# Patient Record
Sex: Male | Born: 1950
Health system: Southern US, Community
[De-identification: ages and names within clinical notes are randomized; demographics above are authoritative.]

## PROBLEM LIST (undated history)

## (undated) DIAGNOSIS — E119 Type 2 diabetes mellitus without complications: Secondary | ICD-10-CM

## (undated) DIAGNOSIS — K219 Gastro-esophageal reflux disease without esophagitis: Secondary | ICD-10-CM

## (undated) DIAGNOSIS — I459 Conduction disorder, unspecified: Secondary | ICD-10-CM

## (undated) DIAGNOSIS — C801 Malignant (primary) neoplasm, unspecified: Secondary | ICD-10-CM

## (undated) DIAGNOSIS — I10 Essential (primary) hypertension: Secondary | ICD-10-CM

## (undated) HISTORY — PX: SHOULDER ARTHROSCOPY: SHX128

## (undated) HISTORY — PX: TONSILLECTOMY: SUR1361

## (undated) HISTORY — PX: NASAL ENDOSCOPY: SHX286

## (undated) HISTORY — PX: ANKLE FRACTURE SURGERY: SHX122

## (undated) HISTORY — PX: SKIN GRAFT: SHX250

---

## 2006-05-03 ENCOUNTER — Other Ambulatory Visit: Payer: Self-pay

## 2006-05-13 ENCOUNTER — Ambulatory Visit: Payer: Self-pay | Admitting: Surgery

## 2008-06-03 ENCOUNTER — Ambulatory Visit: Payer: Self-pay | Admitting: Internal Medicine

## 2009-04-05 ENCOUNTER — Emergency Department: Payer: Self-pay | Admitting: Internal Medicine

## 2011-11-08 ENCOUNTER — Ambulatory Visit: Payer: Self-pay | Admitting: Internal Medicine

## 2011-11-12 ENCOUNTER — Ambulatory Visit: Payer: Self-pay | Admitting: Internal Medicine

## 2011-12-13 ENCOUNTER — Ambulatory Visit: Payer: Self-pay | Admitting: Internal Medicine

## 2013-04-04 ENCOUNTER — Ambulatory Visit: Payer: Self-pay | Admitting: Unknown Physician Specialty

## 2014-03-13 DIAGNOSIS — I10 Essential (primary) hypertension: Secondary | ICD-10-CM | POA: Insufficient documentation

## 2014-03-13 DIAGNOSIS — R002 Palpitations: Secondary | ICD-10-CM | POA: Insufficient documentation

## 2014-03-13 DIAGNOSIS — E782 Mixed hyperlipidemia: Secondary | ICD-10-CM | POA: Insufficient documentation

## 2014-03-13 DIAGNOSIS — E119 Type 2 diabetes mellitus without complications: Secondary | ICD-10-CM | POA: Insufficient documentation

## 2014-04-13 DIAGNOSIS — I739 Peripheral vascular disease, unspecified: Secondary | ICD-10-CM | POA: Insufficient documentation

## 2014-12-31 ENCOUNTER — Ambulatory Visit
Admit: 2014-12-31 | Disposition: A | Discharge status: Home / Self Care | Payer: Self-pay | Attending: Otolaryngology | Admitting: Otolaryngology

## 2014-12-31 DIAGNOSIS — Z0000 Encounter for general adult medical examination without abnormal findings: Secondary | ICD-10-CM | POA: Insufficient documentation

## 2015-01-09 ENCOUNTER — Other Ambulatory Visit: Payer: Self-pay | Admitting: Otolaryngology

## 2015-01-09 DIAGNOSIS — R221 Localized swelling, mass and lump, neck: Secondary | ICD-10-CM

## 2015-01-17 ENCOUNTER — Ambulatory Visit
Admission: RE | Admit: 2015-01-17 | Discharge: 2015-01-17 | Disposition: A | Discharge status: Home / Self Care | Payer: Commercial Managed Care - PPO | Source: Ambulatory Visit | Attending: Otolaryngology | Admitting: Otolaryngology

## 2015-01-17 DIAGNOSIS — R221 Localized swelling, mass and lump, neck: Secondary | ICD-10-CM | POA: Diagnosis not present

## 2015-01-17 HISTORY — DX: Type 2 diabetes mellitus without complications: E11.9

## 2015-01-17 LAB — GLUCOSE, CAPILLARY: GLUCOSE-CAPILLARY: 128 mg/dL — AB (ref 70–99)

## 2015-01-17 MED ORDER — FLUDEOXYGLUCOSE F - 18 (FDG) INJECTION
12.4800 | Freq: Once | INTRAVENOUS | Status: AC | PRN
  Administered 2015-01-17: 12.48 via INTRAVENOUS

## 2015-01-21 ENCOUNTER — Encounter: Payer: Self-pay | Admitting: *Deleted

## 2015-01-21 ENCOUNTER — Inpatient Hospital Stay: Admission: RE | Admit: 2015-01-21 | Payer: Commercial Managed Care - PPO | Source: Ambulatory Visit

## 2015-01-21 DIAGNOSIS — R591 Generalized enlarged lymph nodes: Secondary | ICD-10-CM | POA: Diagnosis present

## 2015-01-21 DIAGNOSIS — Z823 Family history of stroke: Secondary | ICD-10-CM | POA: Diagnosis not present

## 2015-01-21 DIAGNOSIS — Z79899 Other long term (current) drug therapy: Secondary | ICD-10-CM | POA: Diagnosis not present

## 2015-01-21 DIAGNOSIS — Z9889 Other specified postprocedural states: Secondary | ICD-10-CM | POA: Diagnosis not present

## 2015-01-21 DIAGNOSIS — I739 Peripheral vascular disease, unspecified: Secondary | ICD-10-CM | POA: Diagnosis not present

## 2015-01-21 DIAGNOSIS — C029 Malignant neoplasm of tongue, unspecified: Secondary | ICD-10-CM | POA: Diagnosis not present

## 2015-01-21 DIAGNOSIS — Z87891 Personal history of nicotine dependence: Secondary | ICD-10-CM | POA: Diagnosis not present

## 2015-01-21 DIAGNOSIS — I10 Essential (primary) hypertension: Secondary | ICD-10-CM | POA: Diagnosis not present

## 2015-01-21 DIAGNOSIS — E119 Type 2 diabetes mellitus without complications: Secondary | ICD-10-CM | POA: Diagnosis not present

## 2015-01-22 ENCOUNTER — Ambulatory Visit
Admission: RE | Admit: 2015-01-22 | Discharge: 2015-01-22 | Disposition: A | Discharge status: Home / Self Care | Payer: Commercial Managed Care - PPO | Source: Ambulatory Visit | Attending: Otolaryngology | Admitting: Otolaryngology

## 2015-01-22 ENCOUNTER — Other Ambulatory Visit: Payer: Self-pay

## 2015-01-22 ENCOUNTER — Encounter: Payer: Self-pay | Admitting: *Deleted

## 2015-01-22 ENCOUNTER — Other Ambulatory Visit (HOSPITAL_COMMUNITY): Payer: Self-pay

## 2015-01-22 ENCOUNTER — Ambulatory Visit: Payer: Commercial Managed Care - PPO | Admitting: Anesthesiology

## 2015-01-22 ENCOUNTER — Encounter
Admission: RE | Disposition: A | Discharge status: Home / Self Care | Payer: Self-pay | Source: Ambulatory Visit | Attending: Otolaryngology

## 2015-01-22 DIAGNOSIS — Z823 Family history of stroke: Secondary | ICD-10-CM | POA: Insufficient documentation

## 2015-01-22 DIAGNOSIS — Z79899 Other long term (current) drug therapy: Secondary | ICD-10-CM | POA: Insufficient documentation

## 2015-01-22 DIAGNOSIS — I739 Peripheral vascular disease, unspecified: Secondary | ICD-10-CM | POA: Insufficient documentation

## 2015-01-22 DIAGNOSIS — I10 Essential (primary) hypertension: Secondary | ICD-10-CM | POA: Insufficient documentation

## 2015-01-22 DIAGNOSIS — Z9889 Other specified postprocedural states: Secondary | ICD-10-CM | POA: Insufficient documentation

## 2015-01-22 DIAGNOSIS — E119 Type 2 diabetes mellitus without complications: Secondary | ICD-10-CM | POA: Insufficient documentation

## 2015-01-22 DIAGNOSIS — C029 Malignant neoplasm of tongue, unspecified: Secondary | ICD-10-CM | POA: Insufficient documentation

## 2015-01-22 DIAGNOSIS — Z87891 Personal history of nicotine dependence: Secondary | ICD-10-CM | POA: Insufficient documentation

## 2015-01-22 HISTORY — DX: Gastro-esophageal reflux disease without esophagitis: K21.9

## 2015-01-22 HISTORY — PX: DIRECT LARYNGOSCOPY: SHX5326

## 2015-01-22 HISTORY — DX: Conduction disorder, unspecified: I45.9

## 2015-01-22 HISTORY — DX: Essential (primary) hypertension: I10

## 2015-01-22 LAB — GLUCOSE, CAPILLARY: Glucose-Capillary: 111 mg/dL — ABNORMAL HIGH (ref 70–99)

## 2015-01-22 SURGERY — LARYNGOSCOPY, DIRECT
Anesthesia: General

## 2015-01-22 MED ORDER — LIDOCAINE-EPINEPHRINE (PF) 1 %-1:200000 IJ SOLN
INTRAMUSCULAR | Status: AC
  Filled 2015-01-22: qty 30

## 2015-01-22 MED ORDER — SUCCINYLCHOLINE CHLORIDE 20 MG/ML IJ SOLN
INTRAMUSCULAR | Status: DC | PRN
  Administered 2015-01-22: 100 mg via INTRAVENOUS

## 2015-01-22 MED ORDER — LACTATED RINGERS IV SOLN
INTRAVENOUS | Status: DC
  Administered 2015-01-22: 09:00:00 via INTRAVENOUS

## 2015-01-22 MED ORDER — FENTANYL CITRATE (PF) 100 MCG/2ML IJ SOLN: 25.0000 ug | INTRAMUSCULAR | Status: DC | PRN

## 2015-01-22 MED ORDER — MIDAZOLAM HCL 5 MG/5ML IJ SOLN
INTRAMUSCULAR | Status: DC | PRN
  Administered 2015-01-22: 1 mg via INTRAVENOUS

## 2015-01-22 MED ORDER — FAMOTIDINE 20 MG PO TABS
ORAL_TABLET | ORAL | Status: AC
  Filled 2015-01-22: qty 1

## 2015-01-22 MED ORDER — ONDANSETRON HCL 4 MG/2ML IJ SOLN
INTRAMUSCULAR | Status: DC | PRN
  Administered 2015-01-22: 4 mg via INTRAVENOUS

## 2015-01-22 MED ORDER — FENTANYL CITRATE (PF) 250 MCG/5ML IJ SOLN
INTRAMUSCULAR | Status: DC | PRN
  Administered 2015-01-22: 100 ug via INTRAVENOUS

## 2015-01-22 MED ORDER — HYDROCODONE-ACETAMINOPHEN 7.5-325 MG/15ML PO SOLN: 15.0000 mL | ORAL | Status: DC | PRN

## 2015-01-22 MED ORDER — LIDOCAINE HCL (CARDIAC) 20 MG/ML IV SOLN
INTRAVENOUS | Status: DC | PRN
  Administered 2015-01-22: 80 mg via INTRAVENOUS

## 2015-01-22 MED ORDER — FAMOTIDINE 20 MG PO TABS
20.0000 mg | ORAL_TABLET | Freq: Once | ORAL | Status: AC
  Administered 2015-01-22: 20 mg via ORAL

## 2015-01-22 MED ORDER — OXYMETAZOLINE HCL 0.05 % NA SOLN
NASAL | Status: DC | PRN
  Administered 2015-01-22: 5 via NASAL

## 2015-01-22 MED ORDER — ROCURONIUM BROMIDE 100 MG/10ML IV SOLN
INTRAVENOUS | Status: DC | PRN
  Administered 2015-01-22: 10 mg via INTRAVENOUS

## 2015-01-22 MED ORDER — PROPOFOL 10 MG/ML IV BOLUS
INTRAVENOUS | Status: DC | PRN
  Administered 2015-01-22: 200 mg via INTRAVENOUS

## 2015-01-22 MED ORDER — ONDANSETRON HCL 4 MG/2ML IJ SOLN: 4.0000 mg | Freq: Once | INTRAMUSCULAR | Status: DC | PRN

## 2015-01-22 MED ORDER — OXYMETAZOLINE HCL 0.05 % NA SOLN
NASAL | Status: AC
  Filled 2015-01-22: qty 15

## 2015-01-22 SURGICAL SUPPLY — 13 items
DRESSING TELFA 4X3 1S ST N-ADH (GAUZE/BANDAGES/DRESSINGS) ×3 IMPLANT
GLOVE BIO SURGEON STRL SZ7.5 (GLOVE) ×6 IMPLANT
GOWN STRL REUS W/ TWL LRG LVL3 (GOWN DISPOSABLE) ×1 IMPLANT
GOWN STRL REUS W/TWL LRG LVL3 (GOWN DISPOSABLE) ×2
IV SET EXTENSION MINI BORE EPI (IV SETS) IMPLANT
LABEL OR SOLS (LABEL) IMPLANT
NDL ENDOSCOPIC URO 20G (NEEDLE) ×3 IMPLANT
NEEDLE FILTER BLUNT 18X 1/2SAF (NEEDLE) ×2
NEEDLE FILTER BLUNT 18X1 1/2 (NEEDLE) ×1 IMPLANT
PACK HEAD/NECK (MISCELLANEOUS) ×3 IMPLANT
PATTIES SURGICAL .5 X.5 (GAUZE/BANDAGES/DRESSINGS) ×3 IMPLANT
SOL ANTI-FOG 6CC FOG-OUT (MISCELLANEOUS) ×1 IMPLANT
SOL FOG-OUT ANTI-FOG 6CC (MISCELLANEOUS) ×2

## 2015-01-22 NOTE — Anesthesia Postprocedure Evaluation (Signed)
  Anesthesia Post-op Note  Patient: Manuel Buck  Procedure(s) Performed: Procedure(s): DIRECT LARYNGOSCOPY/LARYNGOSCOPY WITH BIOPSY (N/A)  Anesthesia type:General ETT  Patient location: PACU  Post pain: Pain level controlled  Post assessment: Post-op Vital signs reviewed, Patient's Cardiovascular Status Stable, Respiratory Function Stable, Patent Airway and No signs of Nausea or vomiting  Post vital signs: Reviewed and stable  Last Vitals:  Filed Vitals:   01/22/15 1116  BP:   Pulse: 82  Temp:   Resp: 16    Level of consciousness: awake, alert  and patient cooperative  Complications: No apparent anesthesia complications

## 2015-01-22 NOTE — Anesthesia Preprocedure Evaluation (Addendum)
Anesthesia Evaluation  Patient identified by MRN, date of birth, ID band Patient awake    Reviewed: Allergy & Precautions, H&P , Patient's Chart, lab work & pertinent test results  Airway Mallampati: II  TM Distance: >3 FB Neck ROM: Full    Dental  (+) Missing   Pulmonary former smoker (quit x 16 years),          Cardiovascular hypertension, Pt. on medications + dysrhythmias (Occasional PVCs)     Neuro/Psych    GI/Hepatic   Endo/Other  diabetes (On no meds now), Well Controlled, Type 2  Renal/GU      Musculoskeletal   Abdominal   Peds  Hematology   Anesthesia Other Findings   Reproductive/Obstetrics                            Anesthesia Physical Anesthesia Plan  ASA: III  Anesthesia Plan: General ETT   Post-op Pain Management: MAC Combined w/ Regional for Post-op pain   Induction:   Airway Management Planned:   Additional Equipment:   Intra-op Plan:   Post-operative Plan:   Informed Consent:   Plan Discussed with: CRNA  Anesthesia Plan Comments:         Anesthesia Quick Evaluation

## 2015-01-22 NOTE — Discharge Instructions (Addendum)
No heavy lifting. Soft, bland diet, advance as tolerated. Take prescribed pain medication as needed for pain. May switch to plain Tylenol when pain improves. No driving while using narcotic pain medication. Call for difficulty breathing, heavy bleeding from throat, inability to drink fluids.General Anesthesia General anesthesia is a sleep-like state of non-feeling produced by medicines (anesthetics). General anesthesia prevents you from being alert and feeling pain during a medical procedure. Your caregiver may recommend general anesthesia if your procedure:  Is long.  Is painful or uncomfortable.  Would be frightening to see or hear.  Requires you to be still.  Affects your breathing.  Causes significant blood loss. LET YOUR CAREGIVER KNOW ABOUT:  Allergies to food or medicine.  Medicines taken, including vitamins, herbs, eyedrops, over-the-counter medicines, and creams.  Use of steroids (by mouth or creams).  Previous problems with anesthetics or numbing medicines, including problems experienced by relatives.  History of bleeding problems or blood clots.  Previous surgeries and types of anesthetics received.  Possibility of pregnancy, if this applies.  Use of cigarettes, alcohol, or illegal drugs.  Any health condition(s), especially diabetes, sleep apnea, and high blood pressure. RISKS AND COMPLICATIONS General anesthesia rarely causes complications. However, if complications do occur, they can be life threatening. Complications include:  A lung infection.  A stroke.  A heart attack.  Waking up during the procedure. When this occurs, the patient may be unable to move and communicate that he or she is awake. The patient may feel severe pain. Older adults and adults with serious medical problems are more likely to have complications than adults who are young and healthy. Some complications can be prevented by answering all of your caregiver's questions thoroughly and by  following all pre-procedure instructions. It is important to tell your caregiver if any of the pre-procedure instructions, especially those related to diet, were not followed. Any food or liquid in the stomach can cause problems when you are under general anesthesia. BEFORE THE PROCEDURE  Ask your caregiver if you will have to spend the night at the hospital. If you will not have to spend the night, arrange to have an adult drive you and stay with you for 24 hours.  Follow your caregiver's instructions if you are taking dietary supplements or medicines. Your caregiver may tell you to stop taking them or to reduce your dosage.  Do not smoke for as long as possible before your procedure. If possible, stop smoking 3-6 weeks before the procedure.  Do not take new dietary supplements or medicines within 1 week of your procedure unless your caregiver approves them.  Do not eat within 8 hours of your procedure or as directed by your caregiver. Drink only clear liquids, such as water, black coffee (without milk or cream), and fruit juices (without pulp).  Do not drink within 3 hours of your procedure or as directed by your caregiver.  You may brush your teeth on the morning of the procedure, but make sure to spit out the toothpaste and water when finished. PROCEDURE  You will receive anesthetics through a mask, through an intravenous (IV) access tube, or through both. A doctor who specializes in anesthesia (anesthesiologist) or a nurse who specializes in anesthesia (nurse anesthetist) or both will stay with you throughout the procedure to make sure you remain unconscious. He or she will also watch your blood pressure, pulse, and oxygen levels to make sure that the anesthetics do not cause any problems. Once you are asleep, a breathing tube or  mask may be used to help you breathe. AFTER THE PROCEDURE You will wake up after the procedure is complete. You may be in the room where the procedure was performed  or in a recovery area. You may have a sore throat if a breathing tube was used. You may also feel:  Dizzy.  Weak.  Drowsy.  Confused.  Nauseous.  Cold. These are all normal responses and can be expected to last for up to 24 hours after the procedure is complete. A caregiver will tell you when you are ready to go home. This will usually be when you are fully awake and in stable condition. Document Released: 12/07/2007 Document Revised: 01/14/2014 Document Reviewed: 12/29/2011 [facility] Patient Information 2015 [place], [place]. This information is not intended to replace advice given to you by your health care provider. Make sure you discuss any questions you have with your health care provider. Laryngoscopy A laryngoscopy is a procedure performed to view the back of the throat, vocal cords, and voice box (larynx). It may be done in order to figure out why a person has:  A cough.  Voice changes, such as new hoarseness.  Throat pain.  Problems swallowing. During a laryngoscopy, tissue samples (biopsies) can be taken or foreign bodies can be removed. A laryngoscopy can be done in the caregiver's office. It may be performed with a hand-held mirror, or it may require the use of a fiberoptic scope. Under some circumstances, it may be done in a hospital with medicine to help you sleep (general anesthesia). LET YOUR CAREGIVER KNOW ABOUT:   Allergies.  Medicines taken, including herbs, eyedrops, over-the-counter medicines, and creams.  Use of steroids (by mouth or creams).  Previous problems with anesthetics or numbing medicines.  History of bleeding or blood problems.  History of blood clots.  Possibility of pregnancy, if this applies.  Previous surgery.  Other health problems. RISKS AND COMPLICATIONS   Pain.  Gagging.  Vomiting.  Swelling.  Bleeding.  Problems from anesthesia. BEFORE THE PROCEDURE  Several days before the procedure, you may have blood tests to make  sure the blood clots normally. You may be asked to stop taking blood thinners, aspirin, and/or nonsteroidal anti-inflammatory drugs (NSAIDs) before the procedure. Do not give aspirin to children. If general anesthesia is going to be used, you will usually be asked to stop eating and drinking at least 8 hours before the procedure. Have someone go with you to the procedure in order to drive you home afterward. PROCEDURE  If you are having the procedure in the caregiver's office, it is usually performed while sitting up in a special exam chair with a headrest. A numbing medicine will be sprayed into the mouth and on the back of the throat. Your caregiver will use a gauze pad to hold the tongue out of the way. A mirror will be held at the back of the throat to allow your caregiver to see down the throat. You may be asked to make certain sounds so that vocal cord movement can be observed. If a fiberoptic scope is being used, it will be inserted into either the nose or the mouth and slipped into the throat. Your caregiver can look through an eyepiece or can see an image projected on a monitor. Pieces of tissue can be taken (biopsies) or foreign bodies removed. If you need to have the procedure performed under general anesthesia, you will be lying down on a special operating table. The same kinds of procedures will be  followed, but you will not be aware of them. AFTER THE PROCEDURE   When laryngoscopy is done with only local numbing, it usually does not require any changes to your activity level after the procedure is complete.  You may have a sore throat.  You may be asked to rest the voice for some length of time after the procedure.  Do not smoke.  Follow your caregiver's directions regarding eating and drinking after the procedure.  If you had a biopsy taken, your caregiver may advise trying to avoid coughing, whispering, or clearing the throat.  If you were given a general anesthetic or a medicine  to help you relax (sedative), you will be sleepy.  There may be some pain or you may feel sick to your stomach (nauseous), but this can usually be controlled with medicines taken by mouth.  You will stay in the recovery room until awake and able to drink fluids.  You can go back to his or her usual level of activity within several days. HOME CARE INSTRUCTIONS   Take all medicines exactly as directed.  Follow any prescribed diet.  Follow instructions regarding rest (including voice rest) and physical activity.  Follow instructions for the use of throat lozenges or gargles. SEEK IMMEDIATE MEDICAL CARE IF:   You have severe pain.  You have new bleeding during coughing, spitting, or vomiting.  You develop nausea and vomiting.  You develop new problems with swallowing.  You have difficulty breathing or have shortness of breath.  You have chest pain.  Your voice changes.  You have a fever.  You develop a cough. MAKE SURE YOU:   Understand these instructions.  Will watch your condition.  Will get help right away if you are not doing well or get worse. Document Released: 11/24/2009 Document Revised: 01/14/2014 Document Reviewed: 11/24/2009 [facility] Patient Information 2015 [place], [place]. This information is not intended to replace advice given to you by your health care provider. Make sure you discuss any questions you have with your health care provider.

## 2015-01-22 NOTE — Op Note (Signed)
01/22/2015  10:56 AM    Manuel Buck  [id number]    Pre-Op Dx:  Metastatic SCCA to left neck  Post-op Dx: Same  Proc:  Direct Laryngoscopy with Biopsy  Surg:  Riley Nearing  Anes:  General Endotracheal  EBL:  Min  Comp:  None  Findings:  2 cm firm mass in left tongue base. 2.5 cm left group 2 cervical lymph node  Procedure: With the patient in a comfortable supine position, general endotracheal anesthesia was induced without difficulty.  At an appropriate level, the table was turned 90 degrees away from Anesthesia.  A clean preparation and draping was performed in the standard fashion. The oropharynx, oral cavity, nasopharynx and hypopharynx were palpated with findings as described above. A mass was palpable in the left tongue base.  A tooth guard was placed.   Using the Dedo laryngoscope, the oropharynx, hypopharynx and larynx were carefully inspected.   Biopsies were taken from the left base of tongue where the mass had been palpated and identified. Bleeding was controlled with Afrin moistened pledgets.  The findings were as described above.  The laryngoscope was removed.  The neck was palpated on both sides with the findings as described above.  At this point the procedure was completed.  Dental status was intact.  The patient was returned to Anesthesia, awakened, extubated, and transferred to PACU in satisfactory condition.   Dispo:   To PACU in stable condition   Plan:  Discharge home  Riley Nearing 01/22/2015 10:56 AM

## 2015-01-22 NOTE — H&P (Signed)
   History reviewed, patient examined, no change in status, stable for surgery.  

## 2015-01-22 NOTE — Anesthesia Procedure Notes (Addendum)
Performed by: Delaney Meigs   Procedure Name: Intubation Date/Time: 01/22/2015 10:18 AM Performed by: Delaney Meigs Pre-anesthesia Checklist: Patient identified, Emergency Drugs available, Suction available, Patient being monitored and Timeout performed Patient Re-evaluated:Patient Re-evaluated prior to inductionOxygen Delivery Method: Circle system utilized Preoxygenation: Pre-oxygenation with 100% oxygen Intubation Type: IV induction Laryngoscope Size: Miller and 2 Grade View: Grade I Tube type: Oral Tube size: 6.5 mm Number of attempts: 1 Placement Confirmation: ETT inserted through vocal cords under direct vision,  positive ETCO2 and breath sounds checked- equal and bilateral Secured at: 22 cm Tube secured with: Tape Dental Injury: Teeth and Oropharynx as per pre-operative assessment

## 2015-01-22 NOTE — Transfer of Care (Signed)
Immediate Anesthesia Transfer of Care Note  Patient: Manuel Buck  Procedure(s) Performed: Procedure(s): DIRECT LARYNGOSCOPY/LARYNGOSCOPY WITH BIOPSY (N/A)  Patient Location: PACU  Anesthesia Type:General  Level of Consciousness: awake, alert  and oriented  Airway & Oxygen Therapy: Patient Spontanous Breathing and Patient connected to face mask oxygen  Post-op Assessment: Report given to RN, Post -op Vital signs reviewed and stable and Patient moving all extremities X 4  Post vital signs: Reviewed and stable  Last Vitals:  Filed Vitals:   01/22/15 0844  BP: 113/57  Pulse: 64  Temp: 36.7 C  Resp: 20    Complications: No apparent anesthesia complications

## 2015-01-24 ENCOUNTER — Inpatient Hospital Stay: Payer: Commercial Managed Care - PPO | Attending: Internal Medicine | Admitting: Internal Medicine

## 2015-01-24 VITALS — BP 99/64 | HR 68 | Temp 97.5°F | Ht 72.0 in | Wt 209.2 lb

## 2015-01-24 DIAGNOSIS — C01 Malignant neoplasm of base of tongue: Secondary | ICD-10-CM

## 2015-01-24 DIAGNOSIS — G893 Neoplasm related pain (acute) (chronic): Secondary | ICD-10-CM | POA: Diagnosis not present

## 2015-01-24 DIAGNOSIS — K219 Gastro-esophageal reflux disease without esophagitis: Secondary | ICD-10-CM | POA: Diagnosis not present

## 2015-01-24 DIAGNOSIS — Z79899 Other long term (current) drug therapy: Secondary | ICD-10-CM | POA: Diagnosis not present

## 2015-01-24 DIAGNOSIS — Z87891 Personal history of nicotine dependence: Secondary | ICD-10-CM | POA: Diagnosis not present

## 2015-01-24 DIAGNOSIS — E119 Type 2 diabetes mellitus without complications: Secondary | ICD-10-CM | POA: Insufficient documentation

## 2015-01-24 DIAGNOSIS — Z5111 Encounter for antineoplastic chemotherapy: Secondary | ICD-10-CM | POA: Diagnosis not present

## 2015-01-24 LAB — SURGICAL PATHOLOGY

## 2015-01-26 ENCOUNTER — Encounter: Payer: Self-pay | Admitting: Otolaryngology

## 2015-01-27 ENCOUNTER — Encounter: Payer: Self-pay | Admitting: Radiation Oncology

## 2015-01-27 ENCOUNTER — Ambulatory Visit
Admission: RE | Admit: 2015-01-27 | Discharge: 2015-01-27 | Disposition: A | Discharge status: Home / Self Care | Payer: Commercial Managed Care - PPO | Source: Ambulatory Visit | Attending: Radiation Oncology | Admitting: Radiation Oncology

## 2015-01-27 VITALS — BP 141/75 | HR 67 | Temp 97.3°F | Resp 18 | Wt 208.3 lb

## 2015-01-27 DIAGNOSIS — Z510 Encounter for antineoplastic radiation therapy: Secondary | ICD-10-CM | POA: Insufficient documentation

## 2015-01-27 DIAGNOSIS — C01 Malignant neoplasm of base of tongue: Secondary | ICD-10-CM | POA: Insufficient documentation

## 2015-01-27 NOTE — Consult Note (Signed)
Radiation Oncology NEW PATIENT EVALUATION  Name: Manuel Buck  MRN: [id number]  Date:   01/27/2015     DOB: Jan 06, 1951   This 64 y.o. male patient presents to the clinic for initial evaluation of carcinoma the base of tongue.Marland Kitchen  REFERRING PHYSICIAN: Kirk Ruths, MD  CHIEF COMPLAINT:  Chief Complaint  Patient presents with  . Cancer    Pt is here for initial consult for tongue cancer.    DIAGNOSIS: The encounter diagnosis was Malignant neoplasm of base of tongue. stage III (T2 N2 M0) squamous cell carcinoma of base of tongue   PREVIOUS INVESTIGATIONS:  PET CT scan and CT scans reviewed Pathology report reviewed Clinical notes reviewed  HPI: Patient is a 64 year old male who presented over the past several months with increasing dysphagia and a lump in the back of his throat. He went on to develop left neck adenopathy which was palpable. He was seen by ear nose and throat surgeon noticed a 2 cm mass on the left side of base of tongue performed biopsy which was positive for poorly differentiated squamous cell carcinoma. PET CT scan was performed showing hypermetabolic activity in a left level XXIII cervical node and a small right cervical node. Base of tongue was also hypermetabolic. No distant disease was noted. Patient has been seen by medical oncology is now referred to radiation oncology for opinion. He does have some slight dysphasia although weight has been stable. No head and neck pain except secondary to the dysphasia.  PLANNED TREATMENT REGIMEN: Concurrent chemotherapy therapy and radiation therapy with curative intent.  PAST MEDICAL HISTORY:  has a past medical history of Hypertension; GERD (gastroesophageal reflux disease); Skipped heart beats; and Diabetes mellitus without complication.    PAST SURGICAL HISTORY:  Past Surgical History  Procedure Laterality Date  . Nasal endoscopy    . Shoulder arthroscopy Right   . Skin graft    . Ankle fracture surgery    .  Tonsillectomy    . Direct laryngoscopy N/A 01/22/2015    Procedure: DIRECT LARYNGOSCOPY/LARYNGOSCOPY WITH BIOPSY;  Surgeon: Clyde Canterbury, MD;  Location: [facility] ORS;  Service: ENT;  Laterality: N/A;    FAMILY HISTORY: family history is not on file.  SOCIAL HISTORY:  reports that he quit smoking about 21 years ago. He does not have any smokeless tobacco history on file. He reports that he does not drink alcohol or use illicit drugs.  ALLERGIES: Prednisone; Statins; and Sulfa antibiotics  MEDICATIONS:  Current Outpatient Prescriptions  Medication Sig Dispense Refill  . acetaminophen (TYLENOL) 500 MG tablet Take 500 mg by mouth every 6 (six) hours as needed for mild pain or moderate pain.    . Cyanocobalamin (VITAMIN B 12 PO) Take 1 capsule by mouth.    Marland Kitchen lisinopril-hydrochlorothiazide (PRINZIDE,ZESTORETIC) 20-12.5 MG per tablet Take 1 tablet by mouth daily.    . MULTIPLE VITAMIN PO Take 1 tablet by mouth.    . cyanocobalamin (,VITAMIN B-12,) 1000 MCG/ML injection Inject into the muscle.    Marland Kitchen HYDROcodone-acetaminophen (HYCET) 7.5-325 mg/15 ml solution Take 15 mLs by mouth every 4 (four) hours as needed for moderate pain. (Patient not taking: Reported on 01/24/2015) 300 mL 0   No current facility-administered medications for this encounter.    ECOG PERFORMANCE STATUS:  0 - Asymptomatic  REVIEW OF SYSTEMS: Except for the slight dysphasia and adenopathy in the left neck Patient denies any weight loss, fatigue, weakness, fever, chills or night sweats. Patient denies any loss of vision, blurred vision.  Patient denies any ringing  of the ears or hearing loss. No irregular heartbeat. Patient denies heart murmur or history of fainting. Patient denies any chest pain or pain radiating to her upper extremities. Patient denies any shortness of breath, difficulty breathing at night, cough or hemoptysis. Patient denies any swelling in the lower legs. Patient denies any nausea vomiting, vomiting of blood, or  coffee ground material in the vomitus. Patient denies any stomach pain. Patient states has had normal bowel movements no significant constipation or diarrhea. Patient denies any dysuria, hematuria or significant nocturia. Patient denies any problems walking, swelling in the joints or loss of balance. Patient denies any skin changes, loss of hair or loss of weight. Patient denies any excessive worrying or anxiety or significant depression. Patient denies any problems with insomnia. Patient denies excessive thirst, polyuria, polydipsia. Patient denies any swollen glands, patient denies easy bruising or easy bleeding. Patient denies any recent infections, allergies or URI. Patient "s visual fields have not changed significantly in recent time.    PHYSICAL EXAM: BP 141/75 mmHg  Pulse 67  Temp(Src) 97.3 F (36.3 C)  Resp 18  Wt 208 lb 5.4 oz (94.5 kg) Well-developed male in NAD. Oral cavity shows teeth in good state of repair. No oral mucosal lesions are identified. There is some irregularity on indirect mirror examination of the left tongue base. He has a palpable greater than 2 cm left high cervical node. No supraclavicular adenopathy is identified. Lungs are clear to A&P cardiac examination shows regular rate and rhythm. Well-developed well-nourished patient in NAD. HEENT reveals PERLA, EOMI, discs not visualized.  Oral cavity is clear. No oral mucosal lesions are identified. Neck is clear without evidence of cervical or supraclavicular adenopathy. Lungs are clear to A&P. Cardiac examination is essentially unremarkable with regular rate and rhythm without murmur rub or thrill. Abdomen is benign with no organomegaly or masses noted. Motor sensory and DTR levels are equal and symmetric in the upper and lower extremities. Cranial nerves II through XII are grossly intact. Proprioception is intact. No peripheral adenopathy or edema is identified. No motor or sensory levels are noted. Crude visual fields are  within normal range.   LABORATORY DATA:  Pathology reports reviewed  RADIOLOGY RESULTS: PET CT scan CT scans reviewed  IMPRESSION: Stage III locally advanced base of tongue squamous cell carcinoma in 64 year old male.  PLAN: At this time I got over treatment options with the patient. I've explained to him certain centers would try a partial debulking of his base of tongue tumor as well as neck dissection. I believe he is a good candidate based on his locally advanced disease for combination chemotherapy and radiation with curative intent. I would plan on delivering 7000 cGy of I am RT radiation therapy to his head and neck region concentrating that dose in the hypermetabolic areas and treating the rest of the cervical lymph nodes down to supra clavicular fossa with 5400 cGy using I am RT dose painting technique. Risks and benefits of treatment including xerostomia, alteration of taste, fatigue, some dysphasia, skin reaction, alteration of blood counts, all were explained in detail to the patient and his wife. They both seem to comprehend my treatment plan well. Will discuss the case personally with medical oncology. I have set up and ordered CT simulation for later this week.  I would like to take this opportunity for allowing me to participate in the care of your patient.Armstead Peaks., MD

## 2015-01-29 ENCOUNTER — Ambulatory Visit
Admission: RE | Admit: 2015-01-29 | Discharge: 2015-01-29 | Disposition: A | Discharge status: Home / Self Care | Payer: Commercial Managed Care - PPO | Source: Ambulatory Visit | Attending: Radiation Oncology | Admitting: Radiation Oncology

## 2015-01-29 DIAGNOSIS — Z510 Encounter for antineoplastic radiation therapy: Secondary | ICD-10-CM | POA: Diagnosis present

## 2015-01-29 DIAGNOSIS — C01 Malignant neoplasm of base of tongue: Secondary | ICD-10-CM | POA: Diagnosis not present

## 2015-01-29 NOTE — Patient Instructions (Signed)
Cisplatin injection  What is this medicine?  CISPLATIN (SIS pla tin) is a chemotherapy drug. It targets fast dividing cells, like cancer cells, and causes these cells to die. This medicine is used to treat many types of cancer like bladder, ovarian, and testicular cancers.  This medicine may be used for other purposes; ask your health care provider or pharmacist if you have questions.  COMMON BRAND NAME(S): Platinol, Platinol -AQ  What should I tell my health care provider before I take this medicine?  They need to know if you have any of these conditions:  -blood disorders  -hearing problems  -kidney disease  -recent or ongoing radiation therapy  -an unusual or allergic reaction to cisplatin, carboplatin, other chemotherapy, other medicines, foods, dyes, or preservatives  -pregnant or trying to get pregnant  -breast-feeding  How should I use this medicine?  This drug is given as an infusion into a vein. It is administered in a hospital or clinic by a specially trained health care professional.  Talk to your pediatrician regarding the use of this medicine in children. Special care may be needed.  Overdosage: If you think you have taken too much of this medicine contact a poison control center or emergency room at once.  NOTE: This medicine is only for you. Do not share this medicine with others.  What if I miss a dose?  It is important not to miss a dose. Call your doctor or health care professional if you are unable to keep an appointment.  What may interact with this medicine?  -dofetilide  -foscarnet  -medicines for seizures  -medicines to increase blood counts like filgrastim, pegfilgrastim, sargramostim  -probenecid  -pyridoxine used with altretamine  -rituximab  -some antibiotics like amikacin, gentamicin, neomycin, polymyxin B, streptomycin, tobramycin  -sulfinpyrazone  -vaccines  -zalcitabine  Talk to your doctor or health care professional before taking any of these  medicines:  -acetaminophen  -aspirin  -ibuprofen  -ketoprofen  -naproxen  This list may not describe all possible interactions. Give your health care provider a list of all the medicines, herbs, non-prescription drugs, or dietary supplements you use. Also tell them if you smoke, drink alcohol, or use illegal drugs. Some items may interact with your medicine.  What should I watch for while using this medicine?  Your condition will be monitored carefully while you are receiving this medicine. You will need important blood work done while you are taking this medicine.  This drug may make you feel generally unwell. This is not uncommon, as chemotherapy can affect healthy cells as well as cancer cells. Report any side effects. Continue your course of treatment even though you feel ill unless your doctor tells you to stop.  In some cases, you may be given additional medicines to help with side effects. Follow all directions for their use.  Call your doctor or health care professional for advice if you get a fever, chills or sore throat, or other symptoms of a cold or flu. Do not treat yourself. This drug decreases your body's ability to fight infections. Try to avoid being around people who are sick.  This medicine may increase your risk to bruise or bleed. Call your doctor or health care professional if you notice any unusual bleeding.  Be careful brushing and flossing your teeth or using a toothpick because you may get an infection or bleed more easily. If you have any dental work done, tell your dentist you are receiving this medicine.  Avoid taking products   that contain aspirin, acetaminophen, ibuprofen, naproxen, or ketoprofen unless instructed by your doctor. These medicines may hide a fever.  Do not become pregnant while taking this medicine. Women should inform their doctor if they wish to become pregnant or think they might be pregnant. There is a potential for serious side effects to an unborn child. Talk to  your health care professional or pharmacist for more information. Do not breast-feed an infant while taking this medicine.  Drink fluids as directed while you are taking this medicine. This will help protect your kidneys.  Call your doctor or health care professional if you get diarrhea. Do not treat yourself.  What side effects may I notice from receiving this medicine?  Side effects that you should report to your doctor or health care professional as soon as possible:  -allergic reactions like skin rash, itching or hives, swelling of the face, lips, or tongue  -signs of infection - fever or chills, cough, sore throat, pain or difficulty passing urine  -signs of decreased platelets or bleeding - bruising, pinpoint red spots on the skin, black, tarry stools, nosebleeds  -signs of decreased red blood cells - unusually weak or tired, fainting spells, lightheadedness  -breathing problems  -changes in hearing  -gout pain  -low blood counts - This drug may decrease the number of white blood cells, red blood cells and platelets. You may be at increased risk for infections and bleeding.  -nausea and vomiting  -pain, swelling, redness or irritation at the injection site  -pain, tingling, numbness in the hands or feet  -problems with balance, movement  -trouble passing urine or change in the amount of urine  Side effects that usually do not require medical attention (report to your doctor or health care professional if they continue or are bothersome):  -changes in vision  -loss of appetite  -metallic taste in the mouth or changes in taste  This list may not describe all possible side effects. Call your doctor for medical advice about side effects. You may report side effects to FDA at 1-800-FDA-1088.  Where should I keep my medicine?  This drug is given in a hospital or clinic and will not be stored at home.  NOTE: This sheet is a summary. It may not cover all possible information. If you have questions about this medicine,  talk to your doctor, pharmacist, or health care provider.   2015, [employer]vier/Gold Standard. (2007-12-05 14:40:54)

## 2015-01-30 ENCOUNTER — Ambulatory Visit: Payer: Commercial Managed Care - PPO

## 2015-02-01 NOTE — Progress Notes (Signed)
[place]  Telephone:(336) [phone] Fax:(336) [phone]     ID: [name] OB: 22-Feb-1951  MR#: [id number]  TKW#:[id number]  Patient Care Team: Kirk Ruths, MD as PCP - General (Internal Medicine)  CHIEF COMPLAINT/DIAGNOSIS:  Newly diagnosed invasive poorly differentiated squamous cell carcinoma of the left base of the tongue on biopsy done by Dr. Richardson Landry on 01/22/2015. PET scan on 01/17/2015 reports hypermetabolic mass left tongue base (2 cm on CT scan), adjacent level 2/3 necrotic-appearing adenopathy (3.2 cm on CT scan). Clinical stage II (T2, N1, M0).  -  patient referred here for Medical Oncology evaluation.   HISTORY OF PRESENT ILLNESS:  Manuel Buck is a 64 year old gentleman with past medical history as detailed below. Patient more recently had persistent pain in the back of the throat and tongue base area on the left side. He underwent ENT evaluation by Dr. Richardson Landry and was found to have poorly differentiated squamous cell carcinoma of the left base of the time of biopsy done on May 11. CT scan done prior to this biopsy did report a 2 cm mass at the base of the tongue along with adjacent 3.2 cm left-sided lymphadenopathy. PET scan also was hypermetabolic in both of these areas. Patient states that he is currently taking Norco as needed for pain and it is adequately controlling her pain. Denies any bleeding issues. Appetite is good, denies unintentional weight loss. States that he is physically active and ambulatory. He denies any new dyspnea, cough, hemoptysis or chest pain. No new bone pains. No new headaches, imbalance, falls or seizures. No blood in stools or urine. No paresthesias in the extremities.  REVIEW OF SYSTEMS:   ROS CONSTITUTIONAL: As in HPI above. No chills, fever or sweats.    ENT:  No headache, dizziness or epistaxis. Pain at the back of the tongue/adjacent throat on the left side. No ear or jaw pain. Intermittent sinus  drainage. RESPIRATORY:   No cough.  No shortness of breath. No wheezing. No hemoptysis. CARDIAC: History of abnormal heart rhythm. Currently denies palpitations.  No retrosternal chest pain. No orthopnea, PND. GI: Intermittent indigestion, GERD. No abdominal pain, nausea or vomiting. No diarrhea.   GU:  No dysuria or hematuria.  SKIN: No rashes or pruritus. HEMATOLOGIC: denies bleeding symptoms MUSCULOSKELETAL:  No new bone pains.  EXTREMITY:  No new swelling or pain.  NEURO:  No focal weakness. No numbness or tingling of extremities.  No seizures.   ENDOCRINE:  No polyuria or polydipsia.   PS ECOG 1  PAST MEDICAL HISTORY: Past Medical History  Diagnosis Date  . Hypertension   . GERD (gastroesophageal reflux disease)   . Skipped heart beats     occassionally-pt asymptomatic 01-21-2015)  . Diabetes mellitus without complication     Diet Controlled    PAST SURGICAL HISTORY: Past Surgical History  Procedure Laterality Date  . Nasal endoscopy    . Shoulder arthroscopy Right   . Skin graft    . Ankle fracture surgery    . Tonsillectomy    . Direct laryngoscopy N/A 01/22/2015    Procedure: DIRECT LARYNGOSCOPY/LARYNGOSCOPY WITH BIOPSY;  Surgeon: Clyde Canterbury, MD;  Location: [facility] ORS;  Service: ENT;  Laterality: N/A;    FAMILY HISTORY Remarkable for diabetes, hypertension. Denies malignancy.  ADVANCED DIRECTIVES:   SOCIAL HISTORY: History  Substance Use Topics  . Smoking status: Former Smoker    Quit date: 01/20/1994  . Smokeless tobacco: Not on file  . Alcohol Use: No   ex-cigar  smoker, quit 2008. Does admit to secondhand exposure to cigarette smoke in the past years ago.   Allergies  Allergen Reactions  . Prednisone   . Statins Other (See Comments) and Rash  . Sulfa Antibiotics Rash    Current Outpatient Prescriptions  Medication Sig Dispense Refill  . acetaminophen (TYLENOL) 500 MG tablet Take 500 mg by mouth every 6 (six) hours as needed for mild pain or moderate  pain.    . Cyanocobalamin (VITAMIN B 12 PO) Take 1 capsule by mouth.    Marland Kitchen lisinopril-hydrochlorothiazide (PRINZIDE,ZESTORETIC) 20-12.5 MG per tablet Take 1 tablet by mouth daily.    . MULTIPLE VITAMIN PO Take 1 tablet by mouth.    . cyanocobalamin (,VITAMIN B-12,) 1000 MCG/ML injection Inject into the muscle.    Marland Kitchen HYDROcodone-acetaminophen (HYCET) 7.5-325 mg/15 ml solution Take 15 mLs by mouth every 4 (four) hours as needed for moderate pain. (Patient not taking: Reported on 01/24/2015) 300 mL 0   No current facility-administered medications for this visit.    OBJECTIVE: Filed Vitals:   01/24/15 1053  BP: 99/64  Pulse: 68  Temp: 97.5 F (36.4 C)     Body mass index is 28.37 kg/(m^2).    ECOG FS:1 - Symptomatic but completely ambulatory  CONSTITUTIONAL:   Patient is moderately built and nourished individual, alert and oriented, in no acute distress.  No icterus or pallor.   HEENT:   Normocephalic, atraumatic.  Sclera and conjunctivae clear.  No oral thrush. Right tongue base appears swollen. Left upper lymph node enlarged, about 2-3 cm. No right neck adenopathy clinically    NECK:   Neck is supple.  No cervical adenopathy.  RESPIRATORY:   Clear to auscultation. No crepitations or rhonchi. CARDIAC: S1S2, regular rate and rhythm.   GI:   Abdomen is soft, nontender.  No hepatosplenomegaly clinically.   EXTREMITIES: No pedal edema or cyanosis.   LYMPHATICS: No palpable lymphadenopathy in the axillary or inguinal regions. SKIN:   No major bruising.  No rash.   NEUROLOGICAL:   Alert and oriented x 3. Grossly nonfocal. Cranial nerves are intact.  SKELETAL: No obvious swelling or redness in joints.    LAB RESULTS:          STUDIES: 12/31/14 - CT Neck. IMPRESSION: Palpable abnormality corresponds to an abnormal left level IIa lymph node measuring up to 3.2 cm. Small but asymmetric nearby left level 2 and 3 lymph nodes. Subtle associated left base of tongue mass estimated at 2 cm (series 2,  image 38). Constellation most compatible with oropharyngeal carcinoma with imaging stage T1 N2a.  5/6//16 - PET Scan.Nm Pet Image Initial (pi) Skull Base To Thigh  01/17/2015   CLINICAL DATA:  Initial treatment strategy for left neck mass.  EXAM: NUCLEAR MEDICINE PET SKULL BASE TO THIGH  TECHNIQUE: 12.48 mCi F-18 FDG was injected intravenously. Full-ring PET imaging was performed from the skull base to thigh after the radiotracer. CT data was obtained and used for attenuation correction and anatomic localization.  FASTING BLOOD GLUCOSE:  Value: 128 mg/dl  COMPARISON:  Neck CT 12/31/2014  FINDINGS: NECK  The left tongue base mass noted on the prior CT scan is hypermetabolic with SUV max of 9.70. There is also adjacent level 2/3 lymphadenopathy which is hypermetabolic with SUV max of 2.63. No right-sided adenopathy. Low level activity is noted along the right tongue base but no mass is identified.  CHEST  No hypermetabolic mediastinal or hilar nodes. No suspicious pulmonary nodules on the CT scan.  ABDOMEN/PELVIS  No abnormal hypermetabolic activity within the liver, pancreas, adrenal glands, or spleen. No hypermetabolic lymph nodes in the abdomen or pelvis.  SKELETON  No focal hypermetabolic activity to suggest skeletal metastasis.  IMPRESSION: The left tongue base mass is hypermetabolic and consistent with neoplasm. There is adjacent level 2/3 necrotic appearing adenopathy.  No findings for metastatic disease involving the chest, abdomen or pelvis.   Electronically Signed   By: Marijo Sanes M.D.   On: 01/17/2015 11:00     ASSESSMENT / PLAN:   Newly diagnosed invasive poorly differentiated squamous cell carcinoma of the left base of the tongue on biopsy done by Dr. Richardson Landry on 01/22/2015. PET scan on 01/17/2015 reports hypermetabolic mass left tongue base (2 cm on CT scan), adjacent level 2/3 necrotic-appearing adenopathy (3.2 cm on CT scan). Clinical stage II (T2, N1, M0)  -   patient referred here for  Medical Oncology evaluation. Have reviewed records sent by his referring physician. Have independently reviewed CT scan of the neck and PET scan and discussed with patient in detail. Based on radiological staging, biopsy report and d/w ENT Dr.Bennett, it appears that he has stage II poorly differentiated squamous cell carcinoma of the left tongue base. Patient states that he has discussed with Dr. Richardson Landry about surgical option and his understanding is that it would involve extensive surgery with significant change in quality of life and would prefer to avoid surgery, and would like to pursue nonsurgical treatment options. Have explained that for stage II disease, other treatment option would be radiation therapy and will get him to meet with Dr. Baruch Gouty as soon as possible. If radiation oncologist feels the need for concurrent radiosensitizing cisplatin chemotherapy, we will then see him back and plan treatment. Otherwise patient does not need continued oncology follow-up. He does not have any uncontrolled pain issues at this time.     In between visits, the patient has been advised to call or come to the ER in case of fevers, chills, bleeding, acute sickness, or new symptoms. Patient is agreeable to this plan.   Leia Alf, MD   02/01/2015 8:49 PM

## 2015-02-03 ENCOUNTER — Other Ambulatory Visit: Payer: Self-pay | Admitting: Hematology and Oncology

## 2015-02-04 ENCOUNTER — Inpatient Hospital Stay: Payer: Commercial Managed Care - PPO

## 2015-02-04 ENCOUNTER — Inpatient Hospital Stay: Payer: Commercial Managed Care - PPO | Admitting: Hematology and Oncology

## 2015-02-04 ENCOUNTER — Other Ambulatory Visit: Payer: Commercial Managed Care - PPO

## 2015-02-04 ENCOUNTER — Ambulatory Visit: Payer: Commercial Managed Care - PPO

## 2015-02-04 ENCOUNTER — Ambulatory Visit: Payer: Commercial Managed Care - PPO | Admitting: Family Medicine

## 2015-02-04 VITALS — BP 133/74 | HR 71 | Temp 96.9°F | Resp 18 | Ht 72.0 in | Wt 211.0 lb

## 2015-02-04 DIAGNOSIS — C01 Malignant neoplasm of base of tongue: Secondary | ICD-10-CM | POA: Diagnosis not present

## 2015-02-04 LAB — CBC WITH DIFFERENTIAL/PLATELET
Basophils Absolute: 0 10*3/uL (ref 0–0.1)
Basophils Relative: 1 %
Eosinophils Absolute: 0.4 10*3/uL (ref 0–0.7)
Eosinophils Relative: 8 %
HCT: 41.5 % (ref 40.0–52.0)
Hemoglobin: 14 g/dL (ref 13.0–18.0)
Lymphocytes Relative: 27 %
Lymphs Abs: 1.3 10*3/uL (ref 1.0–3.6)
MCH: 32.1 pg (ref 26.0–34.0)
MCHC: 33.7 g/dL (ref 32.0–36.0)
MCV: 95.2 fL (ref 80.0–100.0)
Monocytes Absolute: 0.5 10*3/uL (ref 0.2–1.0)
Monocytes Relative: 10 %
Neutro Abs: 2.6 10*3/uL (ref 1.4–6.5)
Neutrophils Relative %: 54 %
Platelets: 290 10*3/uL (ref 150–440)
RBC: 4.36 MIL/uL — ABNORMAL LOW (ref 4.40–5.90)
RDW: 13.2 % (ref 11.5–14.5)
WBC: 4.7 10*3/uL (ref 3.8–10.6)

## 2015-02-04 LAB — COMPREHENSIVE METABOLIC PANEL
ALT: 43 U/L (ref 17–63)
AST: 45 U/L — ABNORMAL HIGH (ref 15–41)
Albumin: 4 g/dL (ref 3.5–5.0)
Alkaline Phosphatase: 57 U/L (ref 38–126)
Anion gap: 7 (ref 5–15)
BUN: 22 mg/dL — ABNORMAL HIGH (ref 6–20)
CO2: 29 mmol/L (ref 22–32)
Calcium: 9.2 mg/dL (ref 8.9–10.3)
Chloride: 101 mmol/L (ref 101–111)
Creatinine, Ser: 0.97 mg/dL (ref 0.61–1.24)
GFR calc Af Amer: >60 mL/min (ref >60)
GFR calc non Af Amer: >60 mL/min (ref >60)
Glucose, Bld: 153 mg/dL — ABNORMAL HIGH (ref 65–99)
Potassium: 4.2 mmol/L (ref 3.5–5.1)
Sodium: 137 mmol/L (ref 135–145)
Total Bilirubin: 0.7 mg/dL (ref 0.3–1.2)
Total Protein: 7.2 g/dL (ref 6.5–8.1)

## 2015-02-04 LAB — MAGNESIUM: Magnesium: 2 mg/dL (ref 1.7–2.4)

## 2015-02-04 NOTE — Progress Notes (Signed)
Pt nervous about getting treatment. Feels fine otherwise and after speaking with pt and wife and pt is not allergic to prednisone just chol meds and sulfa

## 2015-02-05 DIAGNOSIS — Z510 Encounter for antineoplastic radiation therapy: Secondary | ICD-10-CM | POA: Diagnosis not present

## 2015-02-10 ENCOUNTER — Other Ambulatory Visit: Payer: Self-pay | Admitting: *Deleted

## 2015-02-10 DIAGNOSIS — C029 Malignant neoplasm of tongue, unspecified: Secondary | ICD-10-CM

## 2015-02-11 ENCOUNTER — Inpatient Hospital Stay: Payer: Commercial Managed Care - PPO

## 2015-02-11 ENCOUNTER — Ambulatory Visit: Payer: Commercial Managed Care - PPO

## 2015-02-11 ENCOUNTER — Ambulatory Visit
Admission: RE | Admit: 2015-02-11 | Discharge: 2015-02-11 | Disposition: A | Discharge status: Home / Self Care | Payer: Commercial Managed Care - PPO | Source: Ambulatory Visit | Attending: Radiation Oncology | Admitting: Radiation Oncology

## 2015-02-11 ENCOUNTER — Inpatient Hospital Stay (HOSPITAL_BASED_OUTPATIENT_CLINIC_OR_DEPARTMENT_OTHER): Payer: Commercial Managed Care - PPO | Admitting: Internal Medicine

## 2015-02-11 ENCOUNTER — Other Ambulatory Visit: Payer: Commercial Managed Care - PPO

## 2015-02-11 ENCOUNTER — Ambulatory Visit: Payer: Commercial Managed Care - PPO | Admitting: Internal Medicine

## 2015-02-11 VITALS — BP 122/75 | HR 61 | Temp 97.3°F | Resp 18 | Ht 72.0 in | Wt 208.6 lb

## 2015-02-11 DIAGNOSIS — C01 Malignant neoplasm of base of tongue: Secondary | ICD-10-CM | POA: Diagnosis not present

## 2015-02-11 DIAGNOSIS — Z510 Encounter for antineoplastic radiation therapy: Secondary | ICD-10-CM | POA: Diagnosis not present

## 2015-02-11 DIAGNOSIS — G893 Neoplasm related pain (acute) (chronic): Secondary | ICD-10-CM

## 2015-02-11 DIAGNOSIS — K219 Gastro-esophageal reflux disease without esophagitis: Secondary | ICD-10-CM

## 2015-02-11 DIAGNOSIS — C029 Malignant neoplasm of tongue, unspecified: Secondary | ICD-10-CM

## 2015-02-11 DIAGNOSIS — Z87891 Personal history of nicotine dependence: Secondary | ICD-10-CM

## 2015-02-11 DIAGNOSIS — E119 Type 2 diabetes mellitus without complications: Secondary | ICD-10-CM

## 2015-02-11 DIAGNOSIS — Z79899 Other long term (current) drug therapy: Secondary | ICD-10-CM

## 2015-02-11 LAB — CBC WITH DIFFERENTIAL/PLATELET
BASOS PCT: 1 %
Basophils Absolute: 0 10*3/uL (ref 0–0.1)
EOS ABS: 0.4 10*3/uL (ref 0–0.7)
EOS PCT: 7 %
HCT: 41.1 % (ref 40.0–52.0)
Hemoglobin: 14.1 g/dL (ref 13.0–18.0)
LYMPHS ABS: 1.4 10*3/uL (ref 1.0–3.6)
Lymphocytes Relative: 25 %
MCH: 32.2 pg (ref 26.0–34.0)
MCHC: 34.2 g/dL (ref 32.0–36.0)
MCV: 94.3 fL (ref 80.0–100.0)
MONO ABS: 0.5 10*3/uL (ref 0.2–1.0)
Monocytes Relative: 10 %
Neutro Abs: 3.1 10*3/uL (ref 1.4–6.5)
Neutrophils Relative %: 57 %
Platelets: 299 10*3/uL (ref 150–440)
RBC: 4.36 MIL/uL — AB (ref 4.40–5.90)
RDW: 13.7 % (ref 11.5–14.5)
WBC: 5.5 10*3/uL (ref 3.8–10.6)

## 2015-02-11 LAB — COMPREHENSIVE METABOLIC PANEL
ALT: 61 U/L (ref 17–63)
AST: 90 U/L — AB (ref 15–41)
Albumin: 3.9 g/dL (ref 3.5–5.0)
Alkaline Phosphatase: 53 U/L (ref 38–126)
Anion gap: 4 — ABNORMAL LOW (ref 5–15)
BUN: 18 mg/dL (ref 6–20)
CHLORIDE: 101 mmol/L (ref 101–111)
CO2: 31 mmol/L (ref 22–32)
Calcium: 8.8 mg/dL — ABNORMAL LOW (ref 8.9–10.3)
Creatinine, Ser: 0.94 mg/dL (ref 0.61–1.24)
GFR calc Af Amer: >60 mL/min (ref >60)
GFR calc non Af Amer: >60 mL/min (ref >60)
Glucose, Bld: 173 mg/dL — ABNORMAL HIGH (ref 65–99)
POTASSIUM: 3.7 mmol/L (ref 3.5–5.1)
Sodium: 136 mmol/L (ref 135–145)
Total Bilirubin: 1 mg/dL (ref 0.3–1.2)
Total Protein: 7.1 g/dL (ref 6.5–8.1)

## 2015-02-11 MED ORDER — PALONOSETRON HCL INJECTION 0.25 MG/5ML
0.2500 mg | Freq: Once | INTRAVENOUS | Status: AC
  Administered 2015-02-11: 0.25 mg via INTRAVENOUS
  Filled 2015-02-11: qty 5

## 2015-02-11 MED ORDER — POTASSIUM CHLORIDE 2 MEQ/ML IV SOLN
Freq: Once | INTRAVENOUS | Status: AC
  Administered 2015-02-11: 11:00:00 via INTRAVENOUS
  Filled 2015-02-11: qty 1000

## 2015-02-11 MED ORDER — HEPARIN SOD (PORK) LOCK FLUSH 100 UNIT/ML IV SOLN
500.0000 [IU] | Freq: Once | INTRAVENOUS | Status: DC | PRN
  Filled 2015-02-11: qty 5

## 2015-02-11 MED ORDER — SODIUM CHLORIDE 0.9 % IV SOLN
Freq: Once | INTRAVENOUS | Status: AC
  Administered 2015-02-11: 14:00:00 via INTRAVENOUS
  Filled 2015-02-11: qty 5

## 2015-02-11 MED ORDER — SODIUM CHLORIDE 0.9 % IV SOLN
30.0000 mg/m2 | Freq: Once | INTRAVENOUS | Status: AC
  Administered 2015-02-11: 66 mg via INTRAVENOUS
  Filled 2015-02-11: qty 66

## 2015-02-11 MED ORDER — PROMETHAZINE HCL 25 MG PO TABS: 25.0000 mg | ORAL_TABLET | Freq: Four times a day (QID) | ORAL | Status: DC | PRN

## 2015-02-11 MED ORDER — POTASSIUM CHLORIDE 2 MEQ/ML IV SOLN: Freq: Once | INTRAVENOUS | Status: DC

## 2015-02-11 MED ORDER — SODIUM CHLORIDE 0.9 % IJ SOLN
10.0000 mL | INTRAMUSCULAR | Status: DC | PRN
  Filled 2015-02-11: qty 10

## 2015-02-11 MED ORDER — SODIUM CHLORIDE 0.9 % IV SOLN
Freq: Once | INTRAVENOUS | Status: AC
  Administered 2015-02-11: 11:00:00 via INTRAVENOUS
  Filled 2015-02-11: qty 250

## 2015-02-11 NOTE — Progress Notes (Signed)
[place]  Telephone:(336) [phone] Fax:(336) [phone]     ID: Manuel Buck OB: September 09, 1951  MR#: [id number]  GYI#:[id number]  Patient Care Team: Kirk Ruths, MD as PCP - General (Internal Medicine)  CHIEF COMPLAINT/DIAGNOSIS:  Newly diagnosed invasive poorly differentiated squamous cell carcinoma of the left base of the tongue on biopsy done by Dr. Richardson Landry on 01/22/2015. PET scan on 01/17/2015 reports hypermetabolic mass left tongue base (2 cm on CT scan), adjacent level 2/3 necrotic-appearing adenopathy (3.2 cm on CT scan). Clinical stage II (T2, N1, M0).  -  patient starting radiation today (02/11/15), referred here for concurrent cisplatin chemotherapy.   HISTORY OF PRESENT ILLNESS:  Patient returns for continued oncology followup. He was s/b rad-onc Dr.Chrystal who has referred him back for concurrent chemo with radiation. He starts radiation today. Overall doing steady, eating steady. States that he is physically active and ambulatory. He denies any new dyspnea, cough, hemoptysis or chest pain. No new bone pains. No new headaches, imbalance, falls or seizures. No blood in stools or urine. No paresthesias in the extremities. No pain issues, 0-3/10.  REVIEW OF SYSTEMS:   ROS As in HPI above. In addition, no chills, fever or sweats. No headache, dizziness or epistaxis. Pain at the back of the tongue/adjacent throat on the left side. Denies palpitations.  No retrosternal chest pain. No orthopnea, PND. No abdominal pain, nausea or vomiting. No diarrhea. No dysuria or hematuria. No numbness or tingling of extremities. No polyuria or polydipsia. PS ECOG 1  PAST MEDICAL HISTORY: Past Medical History  Diagnosis Date  . Hypertension   . GERD (gastroesophageal reflux disease)   . Skipped heart beats     occassionally-pt asymptomatic 01-21-2015)  . Diabetes mellitus without complication     Diet Controlled    PAST SURGICAL HISTORY: Past Surgical History    Procedure Laterality Date  . Nasal endoscopy    . Shoulder arthroscopy Right   . Skin graft    . Ankle fracture surgery    . Tonsillectomy    . Direct laryngoscopy N/A 01/22/2015    Procedure: DIRECT LARYNGOSCOPY/LARYNGOSCOPY WITH BIOPSY;  Surgeon: Clyde Canterbury, MD;  Location: [facility] ORS;  Service: ENT;  Laterality: N/A;    FAMILY HISTORY Remarkable for diabetes, hypertension. Denies malignancy.  ADVANCED DIRECTIVES:   SOCIAL HISTORY: History  Substance Use Topics  . Smoking status: Former Smoker    Quit date: 01/20/1994  . Smokeless tobacco: Not on file  . Alcohol Use: No   ex-cigar smoker, quit 2008. Does admit to secondhand exposure to cigarette smoke in the past years ago.   Allergies  Allergen Reactions  . Statins Other (See Comments) and Rash  . Sulfa Antibiotics Rash    Current Outpatient Prescriptions  Medication Sig Dispense Refill  . acetaminophen (TYLENOL) 500 MG tablet Take 500 mg by mouth every 6 (six) hours as needed for mild pain or moderate pain.    . cyanocobalamin 500 MCG tablet Take 500 mcg by mouth daily.    Marland Kitchen lisinopril (PRINIVIL,ZESTRIL) 20 MG tablet Take 20 mg by mouth daily.    . MULTIPLE VITAMIN PO Take 1 tablet by mouth.    . promethazine (PHENERGAN) 25 MG tablet Take 1 tablet (25 mg total) by mouth every 6 (six) hours as needed for nausea or vomiting. 60 tablet 2   No current facility-administered medications for this visit.    OBJECTIVE: Filed Vitals:   02/11/15 0933  BP: 122/75  Pulse: 61  Temp: 97.3 F (  36.3 C)  Resp: 18     Body mass index is 28.28 kg/(m^2).    ECOG FS:1 - Symptomatic but completely ambulatory  CONSTITUTIONAL:   Patient is moderately built and nourished individual, alert and oriented, in no acute distress.  No icterus or pallor.   HEENT:   Normocephalic, atraumatic.  Sclera and conjunctivae clear.  No oral thrush. Right tongue base appears swollen. Left upper lymph node enlarged, about 2-3 cm. No right neck adenopathy  clinically    NECK:   Neck is supple.  No cervical adenopathy.  RESPIRATORY:   Clear to auscultation. No crepitations or rhonchi. CARDIAC: S1S2, regular rate and rhythm.   GI:   Abdomen is soft, nontender.  No hepatosplenomegaly clinically.   EXTREMITIES: No pedal edema or cyanosis.   LYMPHATICS: No palpable lymphadenopathy in the axillary or inguinal regions. SKIN:   No major bruising.  No rash.   NEUROLOGICAL:   Alert and oriented x 3. Grossly nonfocal. Cranial nerves are intact.  SKELETAL: No obvious swelling or redness in joints.    LAB RESULTS: Cr 0.94, K+ 3.7, Ca 8.8, LFT unremarkable except mild AST elevation of 90, WBC 5.5, b 14.1, ANC 3.1, plts 299K.          STUDIES: 12/31/14 - CT Neck. IMPRESSION: Palpable abnormality corresponds to an abnormal left level IIa lymph node measuring up to 3.2 cm. Small but asymmetric nearby left level 2 and 3 lymph nodes. Subtle associated left base of tongue mass estimated at 2 cm (series 2, image 38). Constellation most compatible with oropharyngeal carcinoma with imaging stage T1 N2a.  5/6//16 - PET Scan.Nm Pet Image Initial (pi) Skull Base To Thigh  01/17/2015   CLINICAL DATA:  Initial treatment strategy for left neck mass.  EXAM: NUCLEAR MEDICINE PET SKULL BASE TO THIGH  TECHNIQUE: 12.48 mCi F-18 FDG was injected intravenously. Full-ring PET imaging was performed from the skull base to thigh after the radiotracer. CT data was obtained and used for attenuation correction and anatomic localization.  FASTING BLOOD GLUCOSE:  Value: 128 mg/dl  COMPARISON:  Neck CT 12/31/2014  FINDINGS: NECK  The left tongue base mass noted on the prior CT scan is hypermetabolic with SUV max of 6.43. There is also adjacent level 2/3 lymphadenopathy which is hypermetabolic with SUV max of 3.29. No right-sided adenopathy. Low level activity is noted along the right tongue base but no mass is identified.  CHEST  No hypermetabolic mediastinal or hilar nodes. No suspicious  pulmonary nodules on the CT scan.  ABDOMEN/PELVIS  No abnormal hypermetabolic activity within the liver, pancreas, adrenal glands, or spleen. No hypermetabolic lymph nodes in the abdomen or pelvis.  SKELETON  No focal hypermetabolic activity to suggest skeletal metastasis.  IMPRESSION: The left tongue base mass is hypermetabolic and consistent with neoplasm. There is adjacent level 2/3 necrotic appearing adenopathy.  No findings for metastatic disease involving the chest, abdomen or pelvis.   Electronically Signed   By: Marijo Sanes M.D.   On: 01/17/2015 11:00     ASSESSMENT / PLAN:   1. Stage II (clinical T2N1M0) invasive poorly differentiated squamous cell carcinoma of the left base of the tongue on biopsy done by Dr. Richardson Landry on 01/22/2015. PET scan on 01/17/2015 reports hypermetabolic mass left tongue base (2 cm on CT scan), adjacent level 2/3 necrotic-appearing adenopathy (3.2 cm on CT scan). Clinical stage II (T2, N1, M0)  -  Seen by rad-onc Dr.Chrystal who has referred him back for concurrent chemo with radiation.  He starts radiation today, states he has decided not to pursue surgery. Patient and wife present explained about radiosensitizing role of cisplatin chemotherapy, he is concerned about side effects and will pursue weekly treatment. He was also explained about possible side effects, he is agreeable to pursue this treatment and expressed verbal consent. Start weekly Cisplatin 30 mg/m2 IV with anti-emetics, IV fluids, mannitol, potassium/magnesium supplement. Lab and chemo at 1 week and 2 weeks. Next MD followup at 3 weeks with lab and plan continued treatment.  2. Labs shows mild AST elevation of 90 otherwise LFT unremarkable. Denies alcohol, recent PET scan negative for liver abnormalities. Monitor closely.  3. In between visits, the patient has been advised to call or come to the ER in case of fevers, bleeding, acute sickness or new symptoms. Patient is agreeable to this plan.   Leia Alf, MD   02/11/2015 11:17 PM

## 2015-02-11 NOTE — Progress Notes (Signed)
Pt doing well, eating and drinking good.  Little pain in neck.

## 2015-02-12 ENCOUNTER — Ambulatory Visit
Admission: RE | Admit: 2015-02-12 | Discharge: 2015-02-12 | Disposition: A | Discharge status: Home / Self Care | Payer: Commercial Managed Care - PPO | Source: Ambulatory Visit | Attending: Radiation Oncology | Admitting: Radiation Oncology

## 2015-02-12 DIAGNOSIS — Z510 Encounter for antineoplastic radiation therapy: Secondary | ICD-10-CM | POA: Diagnosis not present

## 2015-02-13 ENCOUNTER — Ambulatory Visit
Admission: RE | Admit: 2015-02-13 | Discharge: 2015-02-13 | Disposition: A | Discharge status: Home / Self Care | Payer: Commercial Managed Care - PPO | Source: Ambulatory Visit | Attending: Radiation Oncology | Admitting: Radiation Oncology

## 2015-02-13 DIAGNOSIS — Z510 Encounter for antineoplastic radiation therapy: Secondary | ICD-10-CM | POA: Diagnosis not present

## 2015-02-14 ENCOUNTER — Ambulatory Visit
Admission: RE | Admit: 2015-02-14 | Discharge: 2015-02-14 | Disposition: A | Discharge status: Home / Self Care | Payer: Commercial Managed Care - PPO | Source: Ambulatory Visit | Attending: Radiation Oncology | Admitting: Radiation Oncology

## 2015-02-14 DIAGNOSIS — Z510 Encounter for antineoplastic radiation therapy: Secondary | ICD-10-CM | POA: Diagnosis not present

## 2015-02-17 ENCOUNTER — Ambulatory Visit
Admission: RE | Admit: 2015-02-17 | Discharge: 2015-02-17 | Disposition: A | Discharge status: Home / Self Care | Payer: Commercial Managed Care - PPO | Source: Ambulatory Visit | Attending: Radiation Oncology | Admitting: Radiation Oncology

## 2015-02-17 DIAGNOSIS — Z510 Encounter for antineoplastic radiation therapy: Secondary | ICD-10-CM | POA: Diagnosis not present

## 2015-02-18 ENCOUNTER — Ambulatory Visit
Admission: RE | Admit: 2015-02-18 | Discharge: 2015-02-18 | Disposition: A | Discharge status: Home / Self Care | Payer: Commercial Managed Care - PPO | Source: Ambulatory Visit | Attending: Radiation Oncology | Admitting: Radiation Oncology

## 2015-02-18 ENCOUNTER — Inpatient Hospital Stay: Payer: Commercial Managed Care - PPO | Attending: Internal Medicine

## 2015-02-18 ENCOUNTER — Inpatient Hospital Stay: Payer: Commercial Managed Care - PPO

## 2015-02-18 VITALS — BP 101/72 | HR 59 | Temp 96.9°F | Resp 20

## 2015-02-18 DIAGNOSIS — C01 Malignant neoplasm of base of tongue: Secondary | ICD-10-CM

## 2015-02-18 DIAGNOSIS — Z9221 Personal history of antineoplastic chemotherapy: Secondary | ICD-10-CM | POA: Diagnosis not present

## 2015-02-18 DIAGNOSIS — J029 Acute pharyngitis, unspecified: Secondary | ICD-10-CM | POA: Insufficient documentation

## 2015-02-18 DIAGNOSIS — Z923 Personal history of irradiation: Secondary | ICD-10-CM | POA: Insufficient documentation

## 2015-02-18 DIAGNOSIS — K219 Gastro-esophageal reflux disease without esophagitis: Secondary | ICD-10-CM | POA: Diagnosis not present

## 2015-02-18 DIAGNOSIS — E119 Type 2 diabetes mellitus without complications: Secondary | ICD-10-CM | POA: Insufficient documentation

## 2015-02-18 DIAGNOSIS — Z510 Encounter for antineoplastic radiation therapy: Secondary | ICD-10-CM | POA: Diagnosis not present

## 2015-02-18 DIAGNOSIS — Z79899 Other long term (current) drug therapy: Secondary | ICD-10-CM | POA: Diagnosis not present

## 2015-02-18 DIAGNOSIS — Z87891 Personal history of nicotine dependence: Secondary | ICD-10-CM | POA: Diagnosis not present

## 2015-02-18 DIAGNOSIS — I10 Essential (primary) hypertension: Secondary | ICD-10-CM | POA: Insufficient documentation

## 2015-02-18 DIAGNOSIS — Z5111 Encounter for antineoplastic chemotherapy: Secondary | ICD-10-CM | POA: Insufficient documentation

## 2015-02-18 LAB — CBC WITH DIFFERENTIAL/PLATELET
Basophils Absolute: 0 10*3/uL (ref 0–0.1)
Basophils Relative: 1 %
Eosinophils Absolute: 0.4 10*3/uL (ref 0–0.7)
Eosinophils Relative: 7 %
HEMATOCRIT: 42.4 % (ref 40.0–52.0)
Hemoglobin: 14.5 g/dL (ref 13.0–18.0)
Lymphocytes Relative: 24 %
Lymphs Abs: 1.4 10*3/uL (ref 1.0–3.6)
MCH: 32.4 pg (ref 26.0–34.0)
MCHC: 34.3 g/dL (ref 32.0–36.0)
MCV: 94.6 fL (ref 80.0–100.0)
MONOS PCT: 10 %
Monocytes Absolute: 0.6 10*3/uL (ref 0.2–1.0)
Neutro Abs: 3.3 10*3/uL (ref 1.4–6.5)
Neutrophils Relative %: 58 %
PLATELETS: 310 10*3/uL (ref 150–440)
RBC: 4.48 MIL/uL (ref 4.40–5.90)
RDW: 13 % (ref 11.5–14.5)
WBC: 5.7 10*3/uL (ref 3.8–10.6)

## 2015-02-18 LAB — BASIC METABOLIC PANEL
ANION GAP: 4 — AB (ref 5–15)
BUN: 22 mg/dL — AB (ref 6–20)
CALCIUM: 8.8 mg/dL — AB (ref 8.9–10.3)
CHLORIDE: 97 mmol/L — AB (ref 101–111)
CO2: 32 mmol/L (ref 22–32)
CREATININE: 1.01 mg/dL (ref 0.61–1.24)
GFR calc non Af Amer: >60 mL/min (ref >60)
GLUCOSE: 160 mg/dL — AB (ref 65–99)
POTASSIUM: 4.3 mmol/L (ref 3.5–5.1)
Sodium: 133 mmol/L — ABNORMAL LOW (ref 135–145)

## 2015-02-18 LAB — HEPATIC FUNCTION PANEL
ALT: 46 U/L (ref 17–63)
AST: 31 U/L (ref 15–41)
Albumin: 3.9 g/dL (ref 3.5–5.0)
Alkaline Phosphatase: 59 U/L (ref 38–126)
BILIRUBIN TOTAL: 0.7 mg/dL (ref 0.3–1.2)
Bilirubin, Direct: <0.1 mg/dL — ABNORMAL LOW (ref 0.1–0.5)
TOTAL PROTEIN: 7.1 g/dL (ref 6.5–8.1)

## 2015-02-18 MED ORDER — SODIUM CHLORIDE 0.9 % IV SOLN
Freq: Once | INTRAVENOUS | Status: AC
  Administered 2015-02-18: 10:00:00 via INTRAVENOUS
  Filled 2015-02-18: qty 1000

## 2015-02-18 MED ORDER — PALONOSETRON HCL INJECTION 0.25 MG/5ML
0.2500 mg | Freq: Once | INTRAVENOUS | Status: AC
  Administered 2015-02-18: 0.25 mg via INTRAVENOUS
  Filled 2015-02-18: qty 5

## 2015-02-18 MED ORDER — SODIUM CHLORIDE 0.9 % IV SOLN
Freq: Once | INTRAVENOUS | Status: AC
  Administered 2015-02-18: 12:00:00 via INTRAVENOUS
  Filled 2015-02-18: qty 5

## 2015-02-18 MED ORDER — SODIUM CHLORIDE 0.9 % IV SOLN
30.0000 mg/m2 | Freq: Once | INTRAVENOUS | Status: AC
  Administered 2015-02-18: 66 mg via INTRAVENOUS
  Filled 2015-02-18: qty 66

## 2015-02-18 MED ORDER — POTASSIUM CHLORIDE 2 MEQ/ML IV SOLN: Freq: Once | INTRAVENOUS | Status: DC

## 2015-02-18 MED ORDER — POTASSIUM CHLORIDE 2 MEQ/ML IV SOLN
Freq: Once | INTRAVENOUS | Status: AC
  Administered 2015-02-18: 10:00:00 via INTRAVENOUS
  Filled 2015-02-18: qty 1000

## 2015-02-19 ENCOUNTER — Ambulatory Visit
Admission: RE | Admit: 2015-02-19 | Discharge: 2015-02-19 | Disposition: A | Discharge status: Home / Self Care | Payer: Commercial Managed Care - PPO | Source: Ambulatory Visit | Attending: Radiation Oncology | Admitting: Radiation Oncology

## 2015-02-19 DIAGNOSIS — Z510 Encounter for antineoplastic radiation therapy: Secondary | ICD-10-CM | POA: Diagnosis not present

## 2015-02-20 ENCOUNTER — Ambulatory Visit
Admission: RE | Admit: 2015-02-20 | Discharge: 2015-02-20 | Disposition: A | Discharge status: Home / Self Care | Payer: Commercial Managed Care - PPO | Source: Ambulatory Visit | Attending: Radiation Oncology | Admitting: Radiation Oncology

## 2015-02-20 DIAGNOSIS — Z510 Encounter for antineoplastic radiation therapy: Secondary | ICD-10-CM | POA: Diagnosis not present

## 2015-02-21 ENCOUNTER — Ambulatory Visit
Admission: RE | Admit: 2015-02-21 | Discharge: 2015-02-21 | Disposition: A | Discharge status: Home / Self Care | Payer: Commercial Managed Care - PPO | Source: Ambulatory Visit | Attending: Radiation Oncology | Admitting: Radiation Oncology

## 2015-02-21 DIAGNOSIS — Z510 Encounter for antineoplastic radiation therapy: Secondary | ICD-10-CM | POA: Diagnosis not present

## 2015-02-24 ENCOUNTER — Ambulatory Visit
Admission: RE | Admit: 2015-02-24 | Discharge: 2015-02-24 | Disposition: A | Discharge status: Home / Self Care | Payer: Commercial Managed Care - PPO | Source: Ambulatory Visit | Attending: Radiation Oncology | Admitting: Radiation Oncology

## 2015-02-24 DIAGNOSIS — Z510 Encounter for antineoplastic radiation therapy: Secondary | ICD-10-CM | POA: Diagnosis not present

## 2015-02-25 ENCOUNTER — Ambulatory Visit: Payer: Commercial Managed Care - PPO

## 2015-02-25 ENCOUNTER — Other Ambulatory Visit: Payer: Self-pay | Admitting: *Deleted

## 2015-02-25 ENCOUNTER — Ambulatory Visit
Admission: RE | Admit: 2015-02-25 | Discharge: 2015-02-25 | Disposition: A | Discharge status: Home / Self Care | Payer: Commercial Managed Care - PPO | Source: Ambulatory Visit | Attending: Radiation Oncology | Admitting: Radiation Oncology

## 2015-02-25 ENCOUNTER — Other Ambulatory Visit: Payer: Commercial Managed Care - PPO

## 2015-02-25 VITALS — BP 128/73 | HR 72 | Temp 97.5°F

## 2015-02-25 DIAGNOSIS — C01 Malignant neoplasm of base of tongue: Secondary | ICD-10-CM

## 2015-02-25 DIAGNOSIS — Z510 Encounter for antineoplastic radiation therapy: Secondary | ICD-10-CM | POA: Diagnosis not present

## 2015-02-25 LAB — BASIC METABOLIC PANEL
Anion gap: 6 (ref 5–15)
BUN: 21 mg/dL — ABNORMAL HIGH (ref 6–20)
CO2: 31 mmol/L (ref 22–32)
Calcium: 9.1 mg/dL (ref 8.9–10.3)
Chloride: 98 mmol/L — ABNORMAL LOW (ref 101–111)
Creatinine, Ser: 0.91 mg/dL (ref 0.61–1.24)
GFR calc Af Amer: >60 mL/min (ref >60)
GFR calc non Af Amer: >60 mL/min (ref >60)
Glucose, Bld: 127 mg/dL — ABNORMAL HIGH (ref 65–99)
Potassium: 3.9 mmol/L (ref 3.5–5.1)
Sodium: 135 mmol/L (ref 135–145)

## 2015-02-25 LAB — CBC WITH DIFFERENTIAL/PLATELET
Basophils Absolute: 0 10*3/uL (ref 0–0.1)
Basophils Relative: 1 %
EOS PCT: 7 %
Eosinophils Absolute: 0.4 10*3/uL (ref 0–0.7)
HCT: 40.8 % (ref 40.0–52.0)
Hemoglobin: 13.8 g/dL (ref 13.0–18.0)
LYMPHS ABS: 0.8 10*3/uL — AB (ref 1.0–3.6)
Lymphocytes Relative: 12 %
MCH: 32.3 pg (ref 26.0–34.0)
MCHC: 33.7 g/dL (ref 32.0–36.0)
MCV: 95.6 fL (ref 80.0–100.0)
MONO ABS: 0.7 10*3/uL (ref 0.2–1.0)
Monocytes Relative: 10 %
Neutro Abs: 4.5 10*3/uL (ref 1.4–6.5)
Neutrophils Relative %: 70 %
Platelets: 281 10*3/uL (ref 150–440)
RBC: 4.26 MIL/uL — ABNORMAL LOW (ref 4.40–5.90)
RDW: 13.4 % (ref 11.5–14.5)
WBC: 6.4 10*3/uL (ref 3.8–10.6)

## 2015-02-25 LAB — HEPATIC FUNCTION PANEL
ALT: 35 U/L (ref 17–63)
AST: 30 U/L (ref 15–41)
Albumin: 3.9 g/dL (ref 3.5–5.0)
Alkaline Phosphatase: 53 U/L (ref 38–126)
Bilirubin, Direct: <0.1 mg/dL — ABNORMAL LOW (ref 0.1–0.5)
Total Bilirubin: 1 mg/dL (ref 0.3–1.2)
Total Protein: 7.3 g/dL (ref 6.5–8.1)

## 2015-02-25 LAB — MAGNESIUM: MAGNESIUM: 2 mg/dL (ref 1.7–2.4)

## 2015-02-25 MED ORDER — POTASSIUM CHLORIDE 2 MEQ/ML IV SOLN: Freq: Once | INTRAVENOUS | Status: DC

## 2015-02-25 MED ORDER — PALONOSETRON HCL INJECTION 0.25 MG/5ML
0.2500 mg | Freq: Once | INTRAVENOUS | Status: AC
Start: 2015-02-25 — End: 2015-02-25
  Administered 2015-02-25: 0.25 mg via INTRAVENOUS
  Filled 2015-02-25: qty 5

## 2015-02-25 MED ORDER — DEXTROSE-NACL 5-0.45 % IV SOLN
Freq: Once | INTRAVENOUS | Status: AC
  Administered 2015-02-25: 10:00:00 via INTRAVENOUS
  Filled 2015-02-25: qty 10

## 2015-02-25 MED ORDER — SUCRALFATE 1 G PO TABS: 1.0000 g | ORAL_TABLET | Freq: Three times a day (TID) | ORAL | Status: DC

## 2015-02-25 MED ORDER — SODIUM CHLORIDE 0.9 % IV SOLN
Freq: Once | INTRAVENOUS | Status: AC
  Administered 2015-02-25: 10:00:00 via INTRAVENOUS
  Filled 2015-02-25: qty 1000

## 2015-02-25 MED ORDER — SODIUM CHLORIDE 0.9 % IV SOLN
Freq: Once | INTRAVENOUS | Status: AC
  Administered 2015-02-25: 12:00:00 via INTRAVENOUS
  Filled 2015-02-25: qty 5

## 2015-02-25 MED ORDER — SODIUM CHLORIDE 0.9 % IV SOLN
30.0000 mg/m2 | Freq: Once | INTRAVENOUS | Status: AC
  Administered 2015-02-25: 66 mg via INTRAVENOUS
  Filled 2015-02-25: qty 66

## 2015-02-26 ENCOUNTER — Ambulatory Visit
Admission: RE | Admit: 2015-02-26 | Discharge: 2015-02-26 | Disposition: A | Discharge status: Home / Self Care | Payer: Commercial Managed Care - PPO | Source: Ambulatory Visit | Attending: Radiation Oncology | Admitting: Radiation Oncology

## 2015-02-26 DIAGNOSIS — Z510 Encounter for antineoplastic radiation therapy: Secondary | ICD-10-CM | POA: Diagnosis not present

## 2015-02-27 ENCOUNTER — Ambulatory Visit
Admission: RE | Admit: 2015-02-27 | Discharge: 2015-02-27 | Disposition: A | Discharge status: Home / Self Care | Payer: Commercial Managed Care - PPO | Source: Ambulatory Visit | Attending: Radiation Oncology | Admitting: Radiation Oncology

## 2015-02-27 DIAGNOSIS — Z510 Encounter for antineoplastic radiation therapy: Secondary | ICD-10-CM | POA: Diagnosis not present

## 2015-02-28 ENCOUNTER — Ambulatory Visit
Admission: RE | Admit: 2015-02-28 | Discharge: 2015-02-28 | Disposition: A | Discharge status: Home / Self Care | Payer: Commercial Managed Care - PPO | Source: Ambulatory Visit | Attending: Radiation Oncology | Admitting: Radiation Oncology

## 2015-02-28 DIAGNOSIS — Z510 Encounter for antineoplastic radiation therapy: Secondary | ICD-10-CM | POA: Diagnosis not present

## 2015-03-03 ENCOUNTER — Ambulatory Visit
Admission: RE | Admit: 2015-03-03 | Discharge: 2015-03-03 | Disposition: A | Discharge status: Home / Self Care | Payer: Commercial Managed Care - PPO | Source: Ambulatory Visit | Attending: Radiation Oncology | Admitting: Radiation Oncology

## 2015-03-03 DIAGNOSIS — Z510 Encounter for antineoplastic radiation therapy: Secondary | ICD-10-CM | POA: Diagnosis not present

## 2015-03-04 ENCOUNTER — Inpatient Hospital Stay: Payer: Commercial Managed Care - PPO

## 2015-03-04 ENCOUNTER — Ambulatory Visit
Admission: RE | Admit: 2015-03-04 | Discharge: 2015-03-04 | Disposition: A | Discharge status: Home / Self Care | Payer: Commercial Managed Care - PPO | Source: Ambulatory Visit | Attending: Radiation Oncology | Admitting: Radiation Oncology

## 2015-03-04 ENCOUNTER — Inpatient Hospital Stay (HOSPITAL_BASED_OUTPATIENT_CLINIC_OR_DEPARTMENT_OTHER): Payer: Commercial Managed Care - PPO | Admitting: Internal Medicine

## 2015-03-04 VITALS — BP 133/73 | HR 67 | Temp 98.1°F | Resp 18 | Ht 73.0 in | Wt 198.5 lb

## 2015-03-04 DIAGNOSIS — Z9221 Personal history of antineoplastic chemotherapy: Secondary | ICD-10-CM

## 2015-03-04 DIAGNOSIS — I10 Essential (primary) hypertension: Secondary | ICD-10-CM | POA: Diagnosis not present

## 2015-03-04 DIAGNOSIS — C01 Malignant neoplasm of base of tongue: Secondary | ICD-10-CM | POA: Diagnosis not present

## 2015-03-04 DIAGNOSIS — Z79899 Other long term (current) drug therapy: Secondary | ICD-10-CM | POA: Diagnosis not present

## 2015-03-04 DIAGNOSIS — Z923 Personal history of irradiation: Secondary | ICD-10-CM

## 2015-03-04 DIAGNOSIS — J029 Acute pharyngitis, unspecified: Secondary | ICD-10-CM | POA: Diagnosis not present

## 2015-03-04 DIAGNOSIS — Z510 Encounter for antineoplastic radiation therapy: Secondary | ICD-10-CM | POA: Diagnosis not present

## 2015-03-04 DIAGNOSIS — K219 Gastro-esophageal reflux disease without esophagitis: Secondary | ICD-10-CM

## 2015-03-04 DIAGNOSIS — E119 Type 2 diabetes mellitus without complications: Secondary | ICD-10-CM

## 2015-03-04 DIAGNOSIS — Z87891 Personal history of nicotine dependence: Secondary | ICD-10-CM

## 2015-03-04 LAB — HEPATIC FUNCTION PANEL
ALT: 31 U/L (ref 17–63)
AST: 32 U/L (ref 15–41)
Albumin: 3.8 g/dL (ref 3.5–5.0)
Alkaline Phosphatase: 57 U/L (ref 38–126)
Bilirubin, Direct: <0.1 mg/dL — ABNORMAL LOW (ref 0.1–0.5)
Total Bilirubin: 0.8 mg/dL (ref 0.3–1.2)
Total Protein: 6.8 g/dL (ref 6.5–8.1)

## 2015-03-04 LAB — BASIC METABOLIC PANEL
Anion gap: 6 (ref 5–15)
BUN: 21 mg/dL — AB (ref 6–20)
CO2: 30 mmol/L (ref 22–32)
Calcium: 8.6 mg/dL — ABNORMAL LOW (ref 8.9–10.3)
Chloride: 95 mmol/L — ABNORMAL LOW (ref 101–111)
Creatinine, Ser: 0.95 mg/dL (ref 0.61–1.24)
GFR calc non Af Amer: >60 mL/min (ref >60)
Glucose, Bld: 178 mg/dL — ABNORMAL HIGH (ref 65–99)
Potassium: 4.3 mmol/L (ref 3.5–5.1)
SODIUM: 131 mmol/L — AB (ref 135–145)

## 2015-03-04 LAB — CBC WITH DIFFERENTIAL/PLATELET
BASOS ABS: 0 10*3/uL (ref 0–0.1)
Basophils Relative: 1 %
EOS PCT: 9 %
Eosinophils Absolute: 0.3 10*3/uL (ref 0–0.7)
HEMATOCRIT: 38.8 % — AB (ref 40.0–52.0)
Hemoglobin: 13.3 g/dL (ref 13.0–18.0)
Lymphocytes Relative: 18 %
Lymphs Abs: 0.6 10*3/uL — ABNORMAL LOW (ref 1.0–3.6)
MCH: 32.6 pg (ref 26.0–34.0)
MCHC: 34.2 g/dL (ref 32.0–36.0)
MCV: 95.1 fL (ref 80.0–100.0)
MONO ABS: 0.4 10*3/uL (ref 0.2–1.0)
Monocytes Relative: 13 %
Neutro Abs: 2 10*3/uL (ref 1.4–6.5)
Neutrophils Relative %: 59 %
Platelets: 303 10*3/uL (ref 150–440)
RBC: 4.08 MIL/uL — ABNORMAL LOW (ref 4.40–5.90)
RDW: 13.7 % (ref 11.5–14.5)
WBC: 3.3 10*3/uL — ABNORMAL LOW (ref 3.8–10.6)

## 2015-03-04 MED ORDER — SODIUM CHLORIDE 0.9 % IV SOLN
Freq: Once | INTRAVENOUS | Status: AC
  Administered 2015-03-04: 14:00:00 via INTRAVENOUS
  Filled 2015-03-04: qty 5

## 2015-03-04 MED ORDER — SODIUM CHLORIDE 0.9 % IV SOLN
Freq: Once | INTRAVENOUS | Status: AC
  Administered 2015-03-04: 11:00:00 via INTRAVENOUS
  Filled 2015-03-04: qty 1000

## 2015-03-04 MED ORDER — PALONOSETRON HCL INJECTION 0.25 MG/5ML
0.2500 mg | Freq: Once | INTRAVENOUS | Status: AC
  Administered 2015-03-04: 0.25 mg via INTRAVENOUS
  Filled 2015-03-04: qty 5

## 2015-03-04 MED ORDER — SODIUM CHLORIDE 0.9 % IV SOLN
30.0000 mg/m2 | Freq: Once | INTRAVENOUS | Status: AC
  Administered 2015-03-04: 66 mg via INTRAVENOUS
  Filled 2015-03-04: qty 66

## 2015-03-04 MED ORDER — POTASSIUM CHLORIDE 2 MEQ/ML IV SOLN
Freq: Once | INTRAVENOUS | Status: AC
  Administered 2015-03-04: 11:00:00 via INTRAVENOUS
  Filled 2015-03-04: qty 1000

## 2015-03-05 ENCOUNTER — Ambulatory Visit: Payer: Commercial Managed Care - PPO

## 2015-03-06 ENCOUNTER — Ambulatory Visit: Payer: Commercial Managed Care - PPO

## 2015-03-07 ENCOUNTER — Ambulatory Visit: Payer: Commercial Managed Care - PPO

## 2015-03-10 ENCOUNTER — Ambulatory Visit
Admission: RE | Admit: 2015-03-10 | Discharge: 2015-03-10 | Disposition: A | Discharge status: Home / Self Care | Payer: Commercial Managed Care - PPO | Source: Ambulatory Visit | Attending: Radiation Oncology | Admitting: Radiation Oncology

## 2015-03-10 DIAGNOSIS — Z510 Encounter for antineoplastic radiation therapy: Secondary | ICD-10-CM | POA: Diagnosis not present

## 2015-03-10 NOTE — Progress Notes (Signed)
Manuel Buck  [phone] [phone]) [phone]     ID: RENO CLASBY OB: 1951/07/28  MR#: [id number]  LFY#:[id number]  Patient Care Team: Kirk Ruths, MD as PCP - General (Internal Medicine)  CHIEF COMPLAINT/DIAGNOSIS:  Invasive poorly differentiated squamous cell carcinoma of the left base of the tongue, Clinical stage II (T2, N1, M0). Diagnosed by biopsy done by Dr. Richardson Landry on 01/22/2015. PET scan on 01/17/2015 reports hypermetabolic mass left tongue base (2 cm on CT scan), adjacent level 2/3 necrotic-appearing adenopathy (3.2 cm on CT scan).  - patient started radiation on 02/11/15, alongwith, concurrent cisplatin chemotherapy.   HISTORY OF PRESENT ILLNESS:  Patient returns for continued oncology followup and plan next dose of cisplatin treatment. States he has mild sore throat from XRT. Otherwise states he is doing steady, eating steady. States that he is physically active and ambulatory. He denies any new dyspnea, cough, hemoptysis or chest pain. No new bone pains. No new headaches, imbalance, falls or seizures. No blood in stools or urine. No paresthesias in the extremities. No pain issues, 0-4/10.  REVIEW OF SYSTEMS:   ROS As in HPI above. In addition, no fever, chills. No new headaches or focal weakness.  No new mood disturbances. No  sore throat, cough, shortness of breath, sputum, hemoptysis or chest pain. No dizziness or palpitation. No abdominal pain, constipation, diarrhea, dysuria or hematuria. No new skin rash or bleeding symptoms. No new paresthesias in extremities. PS ECOG 1.  PAST MEDICAL HISTORY: Reviewed. Past Medical History  Diagnosis Date  . Hypertension   . GERD (gastroesophageal reflux disease)   . Skipped heart beats     occassionally-pt asymptomatic 01-21-2015)  . Diabetes mellitus without complication     Diet Controlled    PAST SURGICAL HISTORY: Reviewed. Past Surgical History  Procedure Laterality Date  . Nasal  endoscopy    . Shoulder arthroscopy Right   . Skin graft    . Ankle fracture surgery    . Tonsillectomy    . Direct laryngoscopy N/A 01/22/2015    Procedure: DIRECT LARYNGOSCOPY/LARYNGOSCOPY WITH BIOPSY;  Surgeon: Clyde Canterbury, MD;  Location: [facility];  Service: ENT;  Laterality: N/A;    FAMILY HISTORY: Reviewed. Remarkable for diabetes, hypertension. Denies malignancy.  ADVANCED DIRECTIVES:  No - patient declined information  SOCIAL HISTORY: Reviewed. History  Substance Use Topics  . Smoking status: Former Smoker    Quit date: 01/20/1994  . Smokeless tobacco: Not on file  . Alcohol Use: No    Allergies  Allergen Reactions  . Statins Other (See Comments) and Rash  . Sulfa Antibiotics Rash    Current Outpatient Prescriptions  Medication Sig Dispense Refill  . acetaminophen (TYLENOL) 500 MG tablet Take 500 mg by mouth every 6 (six) hours as needed for mild pain or moderate pain.    . cyanocobalamin 500 MCG tablet Take 500 mcg by mouth daily.    Marland Kitchen lisinopril (PRINIVIL,ZESTRIL) 20 MG tablet Take 20 mg by mouth daily.    . MULTIPLE VITAMIN PO Take 1 tablet by mouth.    . promethazine (PHENERGAN) 25 MG tablet Take 1 tablet (25 mg total) by mouth every 6 (six) hours as needed for nausea or vomiting. 60 tablet 2  . sucralfate (CARAFATE) 1 G tablet Take 1 tablet (1 g total) by mouth 4 (four) times daily -  with meals and at bedtime. 90 tablet 2   No current facility-administered medications for this visit.    PHYSICAL EXAM: Filed Vitals:  03/04/15 0949  BP: 133/73  Pulse: 67  Temp: 98.1 F (36.7 C)  Resp: 18     Body mass index is 26.2 kg/(m^2).    ECOG FS:1 - Symptomatic but completely ambulatory  GENERAL: Patient is alert and oriented and in no acute distress. There is no icterus. HEENT: EOMs intact. Oral exam negative for thrush or lesions. Left upper cervical lymphadenopathy 2 cms. CVS: S1S2, regular LUNGS: Bilaterally clear to auscultation, no rhonchi. ABDOMEN:  Soft, nontender. No hepatomegaly clinically.  NEURO: grossly nonfocal, cranial nerves are intact. Gait unremarkable. EXTREMITIES: No pedal edema.  LAB RESULTS:    Component Value Date/Time   NA 131* 03/04/2015 0835   K 4.3 03/04/2015 0835   CL 95* 03/04/2015 0835   CO2 30 03/04/2015 0835   GLUCOSE 178* 03/04/2015 0835   BUN 21* 03/04/2015 0835   CREATININE 0.95 03/04/2015 0835   CALCIUM 8.6* 03/04/2015 0835   PROT 6.8 03/04/2015 0835   ALBUMIN 3.8 03/04/2015 0835   AST 32 03/04/2015 0835   ALT 31 03/04/2015 0835   ALKPHOS 57 03/04/2015 0835   BILITOT 0.8 03/04/2015 0835   GFRNONAA >60 03/04/2015 0835   GFRAA >60 03/04/2015 0835   Lab Results  Component Value Date   WBC 3.3* 03/04/2015   NEUTROABS 2.0 03/04/2015   HGB 13.3 03/04/2015   HCT 38.8* 03/04/2015   MCV 95.1 03/04/2015   PLT 303 03/04/2015     STUDIES: 12/31/14 - CT Neck. IMPRESSION: Palpable abnormality corresponds to an abnormal left level IIa lymph node measuring up to 3.2 cm. Small but asymmetric nearby left level 2 and 3 lymph nodes. Subtle associated left base of tongue mass estimated at 2 cm (series 2, image 38). Constellation most compatible with oropharyngeal carcinoma with imaging stage T1 N2a.   STAGING: Clinical stage II (T2, N1, M0).   ASSESSMENT / PLAN:   1. Stage II (clinical T2N1M0) invasive poorly differentiated squamous cell carcinoma of the left base of the tongue on biopsy done by Dr. Richardson Landry on 01/22/2015. PET scan on 01/17/2015 reports hypermetabolic mass left tongue base (2 cm on CT scan), adjacent level 2/3 necrotic-appearing adenopathy (3.2 cm on CT scan). Clinical stage II (T2, N1, M0).Started radiation on 02/11/15, alongwith, concurrent cisplatin chemotherapy  - reviewed labs from today and d/w patient. Overall no major side effects from cisplatin treatment. Tentatively completes XRT around 7/25. Patient and wife present explained that cisplatin is planned for duration of XRT. He is  agreeable to continue on this treatment. Will proceed with next dose of weekly Cisplatin 30 mg/m2 IV with anti-emetics, IV fluids, mannitol, potassium/magnesium supplement. Lab and chemo at 1 week and 2 weeks. Next MD followup at 3 weeks with lab and plan continued treatment.      2. Recent mild AST elevation of 90 - Denies alcohol, recent PET scan negative for liver abnormalities. LFT WNL today. Will monitor.      3. In between visits, the patient has been advised to call or come to the ER in case of fevers, bleeding, acute sickness or new symptoms. Patient is agreeable to this plan.    Leia Alf, MD   03/10/2015 7:41 AM

## 2015-03-11 ENCOUNTER — Ambulatory Visit
Admission: RE | Admit: 2015-03-11 | Discharge: 2015-03-11 | Disposition: A | Discharge status: Home / Self Care | Payer: Commercial Managed Care - PPO | Source: Ambulatory Visit | Attending: Radiation Oncology | Admitting: Radiation Oncology

## 2015-03-11 ENCOUNTER — Other Ambulatory Visit: Payer: Commercial Managed Care - PPO

## 2015-03-11 ENCOUNTER — Inpatient Hospital Stay: Payer: Commercial Managed Care - PPO

## 2015-03-11 DIAGNOSIS — Z510 Encounter for antineoplastic radiation therapy: Secondary | ICD-10-CM | POA: Diagnosis not present

## 2015-03-11 DIAGNOSIS — C01 Malignant neoplasm of base of tongue: Secondary | ICD-10-CM

## 2015-03-11 LAB — CBC WITH DIFFERENTIAL/PLATELET
Basophils Absolute: 0 10*3/uL (ref 0–0.1)
Basophils Relative: 1 %
Eosinophils Absolute: 0.2 10*3/uL (ref 0–0.7)
Eosinophils Relative: 8 %
HCT: 37.5 % — ABNORMAL LOW (ref 40.0–52.0)
HEMOGLOBIN: 12.8 g/dL — AB (ref 13.0–18.0)
Lymphocytes Relative: 20 %
Lymphs Abs: 0.5 10*3/uL — ABNORMAL LOW (ref 1.0–3.6)
MCH: 32.6 pg (ref 26.0–34.0)
MCHC: 34.2 g/dL (ref 32.0–36.0)
MCV: 95.3 fL (ref 80.0–100.0)
MONO ABS: 0.4 10*3/uL (ref 0.2–1.0)
Monocytes Relative: 14 %
NEUTROS PCT: 57 %
Neutro Abs: 1.5 10*3/uL (ref 1.4–6.5)
Platelets: 213 10*3/uL (ref 150–440)
RBC: 3.94 MIL/uL — AB (ref 4.40–5.90)
RDW: 13.8 % (ref 11.5–14.5)
WBC: 2.6 10*3/uL — ABNORMAL LOW (ref 3.8–10.6)

## 2015-03-11 LAB — BASIC METABOLIC PANEL
Anion gap: 3 — ABNORMAL LOW (ref 5–15)
BUN: 19 mg/dL (ref 6–20)
CO2: 31 mmol/L (ref 22–32)
Calcium: 8.5 mg/dL — ABNORMAL LOW (ref 8.9–10.3)
Chloride: 98 mmol/L — ABNORMAL LOW (ref 101–111)
Creatinine, Ser: 0.83 mg/dL (ref 0.61–1.24)
GFR calc Af Amer: >60 mL/min (ref >60)
GLUCOSE: 132 mg/dL — AB (ref 65–99)
POTASSIUM: 3.8 mmol/L (ref 3.5–5.1)
Sodium: 132 mmol/L — ABNORMAL LOW (ref 135–145)

## 2015-03-11 MED ORDER — SODIUM CHLORIDE 0.9 % IV SOLN
30.0000 mg/m2 | Freq: Once | INTRAVENOUS | Status: AC
  Administered 2015-03-11: 66 mg via INTRAVENOUS
  Filled 2015-03-11: qty 66

## 2015-03-11 MED ORDER — POTASSIUM CHLORIDE 2 MEQ/ML IV SOLN
Freq: Once | INTRAVENOUS | Status: AC
  Administered 2015-03-11: 11:00:00 via INTRAVENOUS
  Filled 2015-03-11: qty 1000

## 2015-03-11 MED ORDER — SODIUM CHLORIDE 0.9 % IV SOLN
Freq: Once | INTRAVENOUS | Status: AC
  Administered 2015-03-11: 20 mL/h via INTRAVENOUS
  Filled 2015-03-11: qty 1000

## 2015-03-11 MED ORDER — SODIUM CHLORIDE 0.9 % IV SOLN
Freq: Once | INTRAVENOUS | Status: AC
  Administered 2015-03-11: 13:00:00 via INTRAVENOUS
  Filled 2015-03-11: qty 5

## 2015-03-11 MED ORDER — HEPARIN SOD (PORK) LOCK FLUSH 100 UNIT/ML IV SOLN: 500.0000 [IU] | Freq: Once | INTRAVENOUS | Status: DC | PRN

## 2015-03-11 MED ORDER — PALONOSETRON HCL INJECTION 0.25 MG/5ML
0.2500 mg | Freq: Once | INTRAVENOUS | Status: AC
  Administered 2015-03-11: 0.25 mg via INTRAVENOUS
  Filled 2015-03-11: qty 5

## 2015-03-12 ENCOUNTER — Ambulatory Visit
Admission: RE | Admit: 2015-03-12 | Discharge: 2015-03-12 | Disposition: A | Discharge status: Home / Self Care | Payer: Commercial Managed Care - PPO | Source: Ambulatory Visit | Attending: Radiation Oncology | Admitting: Radiation Oncology

## 2015-03-12 DIAGNOSIS — Z510 Encounter for antineoplastic radiation therapy: Secondary | ICD-10-CM | POA: Diagnosis not present

## 2015-03-13 ENCOUNTER — Ambulatory Visit
Admission: RE | Admit: 2015-03-13 | Discharge: 2015-03-13 | Disposition: A | Discharge status: Home / Self Care | Payer: Commercial Managed Care - PPO | Source: Ambulatory Visit | Attending: Radiation Oncology | Admitting: Radiation Oncology

## 2015-03-13 DIAGNOSIS — Z510 Encounter for antineoplastic radiation therapy: Secondary | ICD-10-CM | POA: Diagnosis not present

## 2015-03-14 ENCOUNTER — Ambulatory Visit
Admission: RE | Admit: 2015-03-14 | Discharge: 2015-03-14 | Disposition: A | Discharge status: Home / Self Care | Payer: Commercial Managed Care - PPO | Source: Ambulatory Visit | Attending: Radiation Oncology | Admitting: Radiation Oncology

## 2015-03-14 DIAGNOSIS — Z510 Encounter for antineoplastic radiation therapy: Secondary | ICD-10-CM | POA: Diagnosis not present

## 2015-03-18 ENCOUNTER — Ambulatory Visit
Admission: RE | Admit: 2015-03-18 | Discharge: 2015-03-18 | Disposition: A | Discharge status: Home / Self Care | Payer: Commercial Managed Care - PPO | Source: Ambulatory Visit | Attending: Radiation Oncology | Admitting: Radiation Oncology

## 2015-03-18 ENCOUNTER — Other Ambulatory Visit: Payer: Commercial Managed Care - PPO

## 2015-03-18 ENCOUNTER — Inpatient Hospital Stay: Payer: Commercial Managed Care - PPO | Attending: Internal Medicine

## 2015-03-18 ENCOUNTER — Inpatient Hospital Stay
Admission: RE | Admit: 2015-03-18 | Discharge: 2015-03-18 | Disposition: A | Discharge status: Home / Self Care | Payer: Self-pay | Source: Ambulatory Visit | Attending: Radiation Oncology | Admitting: Radiation Oncology

## 2015-03-18 ENCOUNTER — Inpatient Hospital Stay: Payer: Commercial Managed Care - PPO

## 2015-03-18 VITALS — BP 122/76 | HR 66 | Temp 96.7°F

## 2015-03-18 DIAGNOSIS — I10 Essential (primary) hypertension: Secondary | ICD-10-CM | POA: Diagnosis not present

## 2015-03-18 DIAGNOSIS — Z87891 Personal history of nicotine dependence: Secondary | ICD-10-CM | POA: Diagnosis not present

## 2015-03-18 DIAGNOSIS — Z79899 Other long term (current) drug therapy: Secondary | ICD-10-CM | POA: Diagnosis not present

## 2015-03-18 DIAGNOSIS — Z5111 Encounter for antineoplastic chemotherapy: Secondary | ICD-10-CM | POA: Insufficient documentation

## 2015-03-18 DIAGNOSIS — Z510 Encounter for antineoplastic radiation therapy: Secondary | ICD-10-CM | POA: Diagnosis not present

## 2015-03-18 DIAGNOSIS — R1310 Dysphagia, unspecified: Secondary | ICD-10-CM | POA: Insufficient documentation

## 2015-03-18 DIAGNOSIS — C01 Malignant neoplasm of base of tongue: Secondary | ICD-10-CM

## 2015-03-18 DIAGNOSIS — E119 Type 2 diabetes mellitus without complications: Secondary | ICD-10-CM | POA: Insufficient documentation

## 2015-03-18 LAB — BASIC METABOLIC PANEL
Anion gap: 5 (ref 5–15)
BUN: 21 mg/dL — ABNORMAL HIGH (ref 6–20)
CO2: 30 mmol/L (ref 22–32)
CREATININE: 0.97 mg/dL (ref 0.61–1.24)
Calcium: 8.5 mg/dL — ABNORMAL LOW (ref 8.9–10.3)
Chloride: 95 mmol/L — ABNORMAL LOW (ref 101–111)
GFR calc Af Amer: >60 mL/min (ref >60)
GFR calc non Af Amer: >60 mL/min (ref >60)
GLUCOSE: 147 mg/dL — AB (ref 65–99)
Potassium: 4 mmol/L (ref 3.5–5.1)
Sodium: 130 mmol/L — ABNORMAL LOW (ref 135–145)

## 2015-03-18 LAB — CBC WITH DIFFERENTIAL/PLATELET
BASOS ABS: 0 10*3/uL (ref 0–0.1)
BASOS PCT: 1 %
EOS ABS: 0.1 10*3/uL (ref 0–0.7)
Eosinophils Relative: 5 %
HCT: 37.1 % — ABNORMAL LOW (ref 40.0–52.0)
HEMOGLOBIN: 13 g/dL (ref 13.0–18.0)
Lymphocytes Relative: 15 %
Lymphs Abs: 0.4 10*3/uL — ABNORMAL LOW (ref 1.0–3.6)
MCH: 33.3 pg (ref 26.0–34.0)
MCHC: 35 g/dL (ref 32.0–36.0)
MCV: 95 fL (ref 80.0–100.0)
MONOS PCT: 13 %
Monocytes Absolute: 0.4 10*3/uL (ref 0.2–1.0)
NEUTROS ABS: 1.9 10*3/uL (ref 1.4–6.5)
Neutrophils Relative %: 66 %
Platelets: 195 10*3/uL (ref 150–440)
RBC: 3.9 MIL/uL — AB (ref 4.40–5.90)
RDW: 14.1 % (ref 11.5–14.5)
WBC: 2.8 10*3/uL — AB (ref 3.8–10.6)

## 2015-03-18 LAB — HEPATIC FUNCTION PANEL
ALK PHOS: 51 U/L (ref 38–126)
ALT: 34 U/L (ref 17–63)
AST: 32 U/L (ref 15–41)
Albumin: 3.7 g/dL (ref 3.5–5.0)
BILIRUBIN DIRECT: 0.2 mg/dL (ref 0.1–0.5)
BILIRUBIN INDIRECT: 0.7 mg/dL (ref 0.3–0.9)
BILIRUBIN TOTAL: 0.9 mg/dL (ref 0.3–1.2)
Total Protein: 6.6 g/dL (ref 6.5–8.1)

## 2015-03-18 MED ORDER — SODIUM CHLORIDE 0.9 % IV SOLN
Freq: Once | INTRAVENOUS | Status: AC
  Administered 2015-03-18: 10:00:00 via INTRAVENOUS
  Filled 2015-03-18: qty 1000

## 2015-03-18 MED ORDER — POTASSIUM CHLORIDE 2 MEQ/ML IV SOLN: Freq: Once | INTRAVENOUS | Status: DC

## 2015-03-18 MED ORDER — SODIUM CHLORIDE 0.9 % IJ SOLN
10.0000 mL | INTRAMUSCULAR | Status: DC | PRN
  Filled 2015-03-18: qty 10

## 2015-03-18 MED ORDER — PALONOSETRON HCL INJECTION 0.25 MG/5ML
0.2500 mg | Freq: Once | INTRAVENOUS | Status: AC
  Administered 2015-03-18: 0.25 mg via INTRAVENOUS
  Filled 2015-03-18: qty 5

## 2015-03-18 MED ORDER — POTASSIUM CHLORIDE 2 MEQ/ML IV SOLN
Freq: Once | INTRAVENOUS | Status: AC
  Administered 2015-03-18: 10:00:00 via INTRAVENOUS
  Filled 2015-03-18: qty 1000

## 2015-03-18 MED ORDER — SODIUM CHLORIDE 0.9 % IV SOLN
30.0000 mg/m2 | Freq: Once | INTRAVENOUS | Status: AC
  Administered 2015-03-18: 66 mg via INTRAVENOUS
  Filled 2015-03-18: qty 66

## 2015-03-18 MED ORDER — FOSAPREPITANT DIMEGLUMINE INJECTION 150 MG
Freq: Once | INTRAVENOUS | Status: AC
  Administered 2015-03-18: 12:00:00 via INTRAVENOUS
  Filled 2015-03-18: qty 5

## 2015-03-19 ENCOUNTER — Ambulatory Visit: Payer: Commercial Managed Care - PPO | Admitting: Oncology

## 2015-03-19 ENCOUNTER — Ambulatory Visit: Payer: Commercial Managed Care - PPO

## 2015-03-19 ENCOUNTER — Ambulatory Visit
Admission: RE | Admit: 2015-03-19 | Discharge: 2015-03-19 | Disposition: A | Discharge status: Home / Self Care | Payer: Commercial Managed Care - PPO | Source: Ambulatory Visit | Attending: Radiation Oncology | Admitting: Radiation Oncology

## 2015-03-19 ENCOUNTER — Other Ambulatory Visit: Payer: Commercial Managed Care - PPO

## 2015-03-19 DIAGNOSIS — Z510 Encounter for antineoplastic radiation therapy: Secondary | ICD-10-CM | POA: Diagnosis not present

## 2015-03-20 ENCOUNTER — Ambulatory Visit
Admission: RE | Admit: 2015-03-20 | Discharge: 2015-03-20 | Disposition: A | Discharge status: Home / Self Care | Payer: Commercial Managed Care - PPO | Source: Ambulatory Visit | Attending: Radiation Oncology | Admitting: Radiation Oncology

## 2015-03-20 DIAGNOSIS — Z510 Encounter for antineoplastic radiation therapy: Secondary | ICD-10-CM | POA: Diagnosis not present

## 2015-03-21 ENCOUNTER — Ambulatory Visit
Admission: RE | Admit: 2015-03-21 | Discharge: 2015-03-21 | Disposition: A | Discharge status: Home / Self Care | Payer: Commercial Managed Care - PPO | Source: Ambulatory Visit | Attending: Radiation Oncology | Admitting: Radiation Oncology

## 2015-03-21 DIAGNOSIS — Z510 Encounter for antineoplastic radiation therapy: Secondary | ICD-10-CM | POA: Diagnosis not present

## 2015-03-24 ENCOUNTER — Ambulatory Visit
Admission: RE | Admit: 2015-03-24 | Discharge: 2015-03-24 | Disposition: A | Discharge status: Home / Self Care | Payer: Commercial Managed Care - PPO | Source: Ambulatory Visit | Attending: Radiation Oncology | Admitting: Radiation Oncology

## 2015-03-24 DIAGNOSIS — Z510 Encounter for antineoplastic radiation therapy: Secondary | ICD-10-CM | POA: Diagnosis not present

## 2015-03-25 ENCOUNTER — Other Ambulatory Visit: Payer: Commercial Managed Care - PPO

## 2015-03-25 ENCOUNTER — Ambulatory Visit
Admission: RE | Admit: 2015-03-25 | Discharge: 2015-03-25 | Disposition: A | Discharge status: Home / Self Care | Payer: Commercial Managed Care - PPO | Source: Ambulatory Visit | Attending: Radiation Oncology | Admitting: Radiation Oncology

## 2015-03-25 ENCOUNTER — Inpatient Hospital Stay: Payer: Commercial Managed Care - PPO

## 2015-03-25 ENCOUNTER — Other Ambulatory Visit: Payer: Self-pay | Admitting: Internal Medicine

## 2015-03-25 ENCOUNTER — Inpatient Hospital Stay: Payer: Commercial Managed Care - PPO | Admitting: Internal Medicine

## 2015-03-25 DIAGNOSIS — C01 Malignant neoplasm of base of tongue: Secondary | ICD-10-CM | POA: Diagnosis not present

## 2015-03-25 DIAGNOSIS — Z510 Encounter for antineoplastic radiation therapy: Secondary | ICD-10-CM | POA: Diagnosis not present

## 2015-03-25 LAB — CBC WITH DIFFERENTIAL/PLATELET
Basophils Absolute: 0 10*3/uL (ref 0–0.1)
Eosinophils Absolute: 0.1 10*3/uL (ref 0–0.7)
HCT: 36.3 % — ABNORMAL LOW (ref 40.0–52.0)
HEMOGLOBIN: 12.4 g/dL — AB (ref 13.0–18.0)
Lymphs Abs: 0.3 10*3/uL — ABNORMAL LOW (ref 1.0–3.6)
MCH: 32.9 pg (ref 26.0–34.0)
MCHC: 34.1 g/dL (ref 32.0–36.0)
MCV: 96.3 fL (ref 80.0–100.0)
MONO ABS: 0.3 10*3/uL (ref 0.2–1.0)
Monocytes Relative: 20 %
NEUTROS ABS: 0.9 10*3/uL — AB (ref 1.4–6.5)
Platelets: 207 10*3/uL (ref 150–440)
RBC: 3.77 MIL/uL — AB (ref 4.40–5.90)
RDW: 14.2 % (ref 11.5–14.5)
WBC: 1.6 10*3/uL — ABNORMAL LOW (ref 3.8–10.6)

## 2015-03-25 LAB — BASIC METABOLIC PANEL
ANION GAP: 11 (ref 5–15)
BUN: 29 mg/dL — ABNORMAL HIGH (ref 6–20)
CHLORIDE: 97 mmol/L — AB (ref 101–111)
CO2: 29 mmol/L (ref 22–32)
Calcium: 9.1 mg/dL (ref 8.9–10.3)
Creatinine, Ser: 1.03 mg/dL (ref 0.61–1.24)
GFR calc Af Amer: >60 mL/min (ref >60)
GFR calc non Af Amer: >60 mL/min (ref >60)
Glucose, Bld: 103 mg/dL — ABNORMAL HIGH (ref 65–99)
POTASSIUM: 4.4 mmol/L (ref 3.5–5.1)
SODIUM: 137 mmol/L (ref 135–145)

## 2015-03-26 ENCOUNTER — Ambulatory Visit
Admission: RE | Admit: 2015-03-26 | Discharge: 2015-03-26 | Disposition: A | Discharge status: Home / Self Care | Payer: Commercial Managed Care - PPO | Source: Ambulatory Visit | Attending: Radiation Oncology | Admitting: Radiation Oncology

## 2015-03-26 DIAGNOSIS — Z510 Encounter for antineoplastic radiation therapy: Secondary | ICD-10-CM | POA: Diagnosis not present

## 2015-03-27 ENCOUNTER — Ambulatory Visit
Admission: RE | Admit: 2015-03-27 | Discharge: 2015-03-27 | Disposition: A | Discharge status: Home / Self Care | Payer: Commercial Managed Care - PPO | Source: Ambulatory Visit | Attending: Radiation Oncology | Admitting: Radiation Oncology

## 2015-03-27 DIAGNOSIS — Z510 Encounter for antineoplastic radiation therapy: Secondary | ICD-10-CM | POA: Diagnosis not present

## 2015-03-28 ENCOUNTER — Ambulatory Visit
Admission: RE | Admit: 2015-03-28 | Discharge: 2015-03-28 | Disposition: A | Discharge status: Home / Self Care | Payer: Commercial Managed Care - PPO | Source: Ambulatory Visit | Attending: Radiation Oncology | Admitting: Radiation Oncology

## 2015-03-28 DIAGNOSIS — Z510 Encounter for antineoplastic radiation therapy: Secondary | ICD-10-CM | POA: Diagnosis not present

## 2015-03-31 ENCOUNTER — Ambulatory Visit
Admission: RE | Admit: 2015-03-31 | Discharge: 2015-03-31 | Disposition: A | Discharge status: Home / Self Care | Payer: Commercial Managed Care - PPO | Source: Ambulatory Visit | Attending: Radiation Oncology | Admitting: Radiation Oncology

## 2015-03-31 DIAGNOSIS — Z510 Encounter for antineoplastic radiation therapy: Secondary | ICD-10-CM | POA: Diagnosis not present

## 2015-04-01 ENCOUNTER — Inpatient Hospital Stay (HOSPITAL_BASED_OUTPATIENT_CLINIC_OR_DEPARTMENT_OTHER): Payer: Commercial Managed Care - PPO | Admitting: Internal Medicine

## 2015-04-01 ENCOUNTER — Other Ambulatory Visit: Payer: Self-pay | Admitting: *Deleted

## 2015-04-01 ENCOUNTER — Ambulatory Visit
Admission: RE | Admit: 2015-04-01 | Discharge: 2015-04-01 | Disposition: A | Discharge status: Home / Self Care | Payer: Commercial Managed Care - PPO | Source: Ambulatory Visit | Attending: Radiation Oncology | Admitting: Radiation Oncology

## 2015-04-01 ENCOUNTER — Inpatient Hospital Stay: Payer: Commercial Managed Care - PPO

## 2015-04-01 VITALS — BP 108/74 | HR 77 | Temp 97.8°F | Resp 16 | Ht 73.0 in | Wt 171.7 lb

## 2015-04-01 VITALS — BP 112/76 | HR 78 | Temp 97.6°F | Resp 18

## 2015-04-01 DIAGNOSIS — R1310 Dysphagia, unspecified: Secondary | ICD-10-CM | POA: Diagnosis not present

## 2015-04-01 DIAGNOSIS — I10 Essential (primary) hypertension: Secondary | ICD-10-CM | POA: Diagnosis not present

## 2015-04-01 DIAGNOSIS — C01 Malignant neoplasm of base of tongue: Secondary | ICD-10-CM

## 2015-04-01 DIAGNOSIS — E119 Type 2 diabetes mellitus without complications: Secondary | ICD-10-CM | POA: Diagnosis not present

## 2015-04-01 DIAGNOSIS — Z79899 Other long term (current) drug therapy: Secondary | ICD-10-CM | POA: Diagnosis not present

## 2015-04-01 DIAGNOSIS — Z87891 Personal history of nicotine dependence: Secondary | ICD-10-CM

## 2015-04-01 DIAGNOSIS — Z510 Encounter for antineoplastic radiation therapy: Secondary | ICD-10-CM | POA: Diagnosis not present

## 2015-04-01 LAB — CBC WITH DIFFERENTIAL/PLATELET
BASOS ABS: 0 10*3/uL (ref 0–0.1)
Basophils Relative: 1 %
EOS ABS: 0.1 10*3/uL (ref 0–0.7)
EOS PCT: 3 %
HEMATOCRIT: 36.7 % — AB (ref 40.0–52.0)
HEMOGLOBIN: 12.7 g/dL — AB (ref 13.0–18.0)
LYMPHS ABS: 0.3 10*3/uL — AB (ref 1.0–3.6)
LYMPHS PCT: 17 %
MCH: 33.4 pg (ref 26.0–34.0)
MCHC: 34.8 g/dL (ref 32.0–36.0)
MCV: 96.1 fL (ref 80.0–100.0)
MONO ABS: 0.4 10*3/uL (ref 0.2–1.0)
Monocytes Relative: 21 %
NEUTROS ABS: 1.1 10*3/uL — AB (ref 1.4–6.5)
NEUTROS PCT: 58 %
Platelets: 211 10*3/uL (ref 150–440)
RBC: 3.82 MIL/uL — ABNORMAL LOW (ref 4.40–5.90)
RDW: 15.3 % — AB (ref 11.5–14.5)
WBC: 1.8 10*3/uL — AB (ref 3.8–10.6)

## 2015-04-01 LAB — BASIC METABOLIC PANEL
Anion gap: 10 (ref 5–15)
BUN: 32 mg/dL — AB (ref 6–20)
CALCIUM: 8.8 mg/dL — AB (ref 8.9–10.3)
CO2: 30 mmol/L (ref 22–32)
Chloride: 94 mmol/L — ABNORMAL LOW (ref 101–111)
Creatinine, Ser: 1.04 mg/dL (ref 0.61–1.24)
GFR calc Af Amer: >60 mL/min (ref >60)
GFR calc non Af Amer: >60 mL/min (ref >60)
GLUCOSE: 125 mg/dL — AB (ref 65–99)
POTASSIUM: 4.3 mmol/L (ref 3.5–5.1)
Sodium: 134 mmol/L — ABNORMAL LOW (ref 135–145)

## 2015-04-01 LAB — MAGNESIUM: Magnesium: 1.8 mg/dL (ref 1.7–2.4)

## 2015-04-01 MED ORDER — PALONOSETRON HCL INJECTION 0.25 MG/5ML
0.2500 mg | Freq: Once | INTRAVENOUS | Status: AC
  Administered 2015-04-01: 0.25 mg via INTRAVENOUS
  Filled 2015-04-01: qty 5

## 2015-04-01 MED ORDER — SODIUM CHLORIDE 0.9 % IV SOLN
30.0000 mg/m2 | Freq: Once | INTRAVENOUS | Status: AC
  Administered 2015-04-01: 66 mg via INTRAVENOUS
  Filled 2015-04-01: qty 66

## 2015-04-01 MED ORDER — MANNITOL 25 % IV SOLN
Freq: Once | INTRAVENOUS | Status: DC
Start: 2015-04-01 — End: 2015-04-01

## 2015-04-01 MED ORDER — SODIUM CHLORIDE 0.9 % IV SOLN
Freq: Once | INTRAVENOUS | Status: AC
  Administered 2015-04-01: 14:00:00 via INTRAVENOUS
  Filled 2015-04-01: qty 5

## 2015-04-01 MED ORDER — SODIUM CHLORIDE 0.9 % IV SOLN
Freq: Once | INTRAVENOUS | Status: AC
  Administered 2015-04-01: 12:00:00 via INTRAVENOUS
  Filled 2015-04-01: qty 1000

## 2015-04-01 MED ORDER — DEXAMETHASONE 4 MG PO TABS: ORAL_TABLET | ORAL | Status: DC

## 2015-04-01 MED ORDER — POTASSIUM CHLORIDE 2 MEQ/ML IV SOLN
Freq: Once | INTRAVENOUS | Status: AC
  Administered 2015-04-01: 12:00:00 via INTRAVENOUS
  Filled 2015-04-01: qty 1000

## 2015-04-01 NOTE — Progress Notes (Signed)
Patient has had increased fatigue. He states that he has not been eating well and has trouble swallowing his food.

## 2015-04-01 NOTE — Progress Notes (Signed)
Manuel Buck  Telephone:(336) [phone] Fax:(336) [phone]     ID: JULIUS MATUS OB: 08/23/1951  MR#: [id number]  ZCH#:[id number]  Patient Care Team: Kirk Ruths, MD as PCP - General (Internal Medicine)  CHIEF COMPLAINT/DIAGNOSIS:  Invasive poorly differentiated squamous cell carcinoma of the left base of the tongue, Clinical stage II (T2, N1, M0). Diagnosed by biopsy done by Dr. Richardson Landry on 01/22/2015. PET scan on 01/17/2015 reports hypermetabolic mass left tongue base (2 cm on CT scan), adjacent level 2/3 necrotic-appearing adenopathy (3.2 cm on CT scan).  - patient started radiation on 02/11/15, alongwith, concurrent cisplatin chemotherapy.   HISTORY OF PRESENT ILLNESS:  Patient returns for continued oncology evaluation, he is on weekly cisplatin chemotherapy. Treatment was held last week due to low absolute neutrophil count. Today it is better at 1100. Patient states that this is his last week of radiation and completes on July 25. Otherwise doing fairly steady. He has had some difficulty swallowing and has lost about 10 pounds but states that he does try to maintain with an sure and is drinking lots of fluids. He denies any dizziness on getting up or ambulating. No nausea vomiting or diarrhea. He denies any new dyspnea, cough, hemoptysis or chest pain. No new headaches, imbalance, falls or seizures. No pain issues, 0-4/10.  REVIEW OF SYSTEMS:   ROS  As in HPI above. In addition, no fever, chills. No new headaches or focal weakness.  No new mood disturbances. No  sore throat, cough, shortness of breath, sputum, hemoptysis or chest pain. No dizziness or palpitation. No abdominal pain, constipation, diarrhea, dysuria or hematuria. No new skin rash or bleeding symptoms. No new paresthesias in extremities. PS ECOG 1.  PAST MEDICAL HISTORY: Reviewed. Past Medical History  Diagnosis Date  . Hypertension   . GERD (gastroesophageal reflux disease)   . Skipped heart beats      occassionally-pt asymptomatic 01-21-2015)  . Diabetes mellitus without complication     Diet Controlled    PAST SURGICAL HISTORY: Reviewed. Past Surgical History  Procedure Laterality Date  . Nasal endoscopy    . Shoulder arthroscopy Right   . Skin graft    . Ankle fracture surgery    . Tonsillectomy    . Direct laryngoscopy N/A 01/22/2015    Procedure: DIRECT LARYNGOSCOPY/LARYNGOSCOPY WITH BIOPSY;  Surgeon: Clyde Canterbury, MD;  Location: [facility];  Service: ENT;  Laterality: N/A;    FAMILY HISTORY: Reviewed. Remarkable for diabetes, hypertension. Denies malignancy.  SOCIAL HISTORY: Reviewed. History  Substance Use Topics  . Smoking status: Former Smoker    Quit date: 01/20/1994  . Smokeless tobacco: Not on file  . Alcohol Use: No    Allergies  Allergen Reactions  . Statins Other (See Comments) and Rash  . Sulfa Antibiotics Rash    Current Outpatient Prescriptions  Medication Sig Dispense Refill  . acetaminophen (TYLENOL) 500 MG tablet Take 500 mg by mouth every 6 (six) hours as needed for mild pain or moderate pain.    . cyanocobalamin 500 MCG tablet Take 500 mcg by mouth daily.    Marland Kitchen dexamethasone (DECADRON) 4 MG tablet Take 4 mg daily x 7 days;  1/2 tablet x 7 days ; 1/2 tablet every other day until prescription complete 14 tablet 0  . lisinopril (PRINIVIL,ZESTRIL) 20 MG tablet Take 20 mg by mouth daily.    . MULTIPLE VITAMIN PO Take 1 tablet by mouth.    . promethazine (PHENERGAN) 25 MG tablet Take 1 tablet (25 mg  total) by mouth every 6 (six) hours as needed for nausea or vomiting. 60 tablet 2  . sucralfate (CARAFATE) 1 G tablet Take 1 tablet (1 g total) by mouth 4 (four) times daily -  with meals and at bedtime. 90 tablet 2   No current facility-administered medications for this visit.   Facility-Administered Medications Ordered in Other Visits  Medication Dose Route Frequency Provider Last Rate Last Dose  . 0.9 %  sodium chloride infusion   Intravenous Once  Leia Alf, MD      . CISplatin (PLATINOL) 66 mg in sodium chloride 0.9 % 250 mL chemo infusion  30 mg/m2 (Treatment Plan Actual) Intravenous Once Leia Alf, MD      . dextrose 5 % and 0.45% NaCl 1,000 mL with potassium chloride 20 mEq, magnesium sulfate 12 mEq, mannitol 12.5 g infusion   Intravenous Once Leia Alf, MD      . fosaprepitant (EMEND) 150 mg, dexamethasone (DECADRON) 12 mg in sodium chloride 0.9 % 145 mL IVPB   Intravenous Once Leia Alf, MD      . palonosetron (ALOXI) injection 0.25 mg  0.25 mg Intravenous Once Leia Alf, MD      . sodium chloride 0.9 % injection 10 mL  10 mL Intracatheter PRN Leia Alf, MD        PHYSICAL EXAM: Filed Vitals:   04/01/15 0944  BP: 108/74  Pulse: 77  Temp: 97.8 F (36.6 C)  Resp: 16     Body mass index is 22.66 kg/(m^2).    ECOG FS:1 - Symptomatic but completely ambulatory  GENERAL: Alert and oriented and in no acute distress. There is no icterus. HEENT: EOMs intact. Oral exam negative for thrush or lesions. Left upper cervical lymphadenopathy smaller, ~1 cm. CVS: S1S2, regular LUNGS: Bilaterally clear to auscultation, no rhonchi. ABDOMEN: Soft, nontender. No hepatomegaly clinically.  NEURO: grossly nonfocal, cranial nerves are intact. Gait unremarkable. EXTREMITIES: No pedal edema.  LAB RESULTS:    Component Value Date/Time   NA 134* 04/01/2015 0840   K 4.3 04/01/2015 0840   CL 94* 04/01/2015 0840   CO2 30 04/01/2015 0840   GLUCOSE 125* 04/01/2015 0840   BUN 32* 04/01/2015 0840   CREATININE 1.04 04/01/2015 0840   CALCIUM 8.8* 04/01/2015 0840   PROT 6.6 03/18/2015 0833   ALBUMIN 3.7 03/18/2015 0833   AST 32 03/18/2015 0833   ALT 34 03/18/2015 0833   ALKPHOS 51 03/18/2015 0833   BILITOT 0.9 03/18/2015 0833   GFRNONAA >60 04/01/2015 0840   GFRAA >60 04/01/2015 0840   Lab Results  Component Value Date   WBC 1.8* 04/01/2015   NEUTROABS 1.1* 04/01/2015   HGB 12.7* 04/01/2015   HCT 36.7* 04/01/2015    MCV 96.1 04/01/2015   PLT 211 04/01/2015     STUDIES: 12/31/14 - CT Neck. IMPRESSION: Palpable abnormality corresponds to an abnormal left level IIa lymph node measuring up to 3.2 cm. Small but asymmetric nearby left level 2 and 3 lymph nodes. Subtle associated left base of tongue mass estimated at 2 cm (series 2, image 38). Constellation most compatible with oropharyngeal carcinoma with imaging stage T1 N2a.   STAGING: Clinical stage II (T2, N1, M0).   ASSESSMENT / PLAN:   1. Stage II (clinical T2N1M0) invasive poorly differentiated squamous cell carcinoma of the left base of the tongue on biopsy done by Dr. Richardson Landry on 01/22/2015. PET scan on 01/17/2015 reports hypermetabolic mass left tongue base (2 cm on CT scan), adjacent level 2/3 necrotic-appearing  adenopathy (3.2 cm on CT scan). Clinical stage II (T2, N1, M0).Started radiation on 02/11/15, alongwith, concurrent cisplatin chemotherapy  - reviewed labs from today and d/w patient. ANC is better today although slightly lower at 1100. Given that it is not dropping on radiation treatment and he had last week off of chemotherapy, I have discussed with patient that he could complete 1 more last dose of cisplatin and he is agreeable to this. Will proceed with next dose of weekly Cisplatin 30 mg/m2 IV with anti-emetics, IV fluids, mannitol, potassium/magnesium supplement. He completes planned radiation on July 25. Lab at 1 week and 2 weeks. We will set up labs and restaging CT scan of the neck in about 5 weeks and see him back after it is done and make continued plan of management.       2. Recent mild AST elevation of 90 - Denies alcohol, recent PET scan negative for liver abnormalities. Last LFT WNL. Will monitor.      3. Nutrition - encouraged patient to increase boost/ensure supplement to 3 times daily and to take frequent snacks in between meals.      4. In between visits, the patient has been advised to call or come to the ER in case of  fevers, bleeding, acute sickness or new symptoms. Patient is agreeable to this plan.    Leia Alf, MD   04/01/2015 11:30 AM

## 2015-04-02 ENCOUNTER — Ambulatory Visit
Admission: RE | Admit: 2015-04-02 | Discharge: 2015-04-02 | Disposition: A | Discharge status: Home / Self Care | Payer: Commercial Managed Care - PPO | Source: Ambulatory Visit | Attending: Radiation Oncology | Admitting: Radiation Oncology

## 2015-04-02 DIAGNOSIS — Z510 Encounter for antineoplastic radiation therapy: Secondary | ICD-10-CM | POA: Diagnosis not present

## 2015-04-03 ENCOUNTER — Ambulatory Visit: Payer: Commercial Managed Care - PPO

## 2015-04-04 ENCOUNTER — Ambulatory Visit
Admission: RE | Admit: 2015-04-04 | Discharge: 2015-04-04 | Disposition: A | Discharge status: Home / Self Care | Payer: Commercial Managed Care - PPO | Source: Ambulatory Visit | Attending: Radiation Oncology | Admitting: Radiation Oncology

## 2015-04-04 DIAGNOSIS — Z510 Encounter for antineoplastic radiation therapy: Secondary | ICD-10-CM | POA: Diagnosis not present

## 2015-04-07 ENCOUNTER — Other Ambulatory Visit: Payer: Self-pay

## 2015-04-07 ENCOUNTER — Inpatient Hospital Stay: Payer: Commercial Managed Care - PPO

## 2015-04-07 ENCOUNTER — Ambulatory Visit
Admission: RE | Admit: 2015-04-07 | Discharge: 2015-04-07 | Disposition: A | Discharge status: Home / Self Care | Payer: Commercial Managed Care - PPO | Source: Ambulatory Visit | Attending: Radiation Oncology | Admitting: Radiation Oncology

## 2015-04-07 DIAGNOSIS — Z510 Encounter for antineoplastic radiation therapy: Secondary | ICD-10-CM | POA: Diagnosis not present

## 2015-04-07 DIAGNOSIS — C01 Malignant neoplasm of base of tongue: Secondary | ICD-10-CM

## 2015-04-07 LAB — CBC WITH DIFFERENTIAL/PLATELET
BASOS PCT: 0 %
Basophils Absolute: 0 10*3/uL (ref 0–0.1)
Eosinophils Absolute: 0 10*3/uL (ref 0–0.7)
Eosinophils Relative: 2 %
HCT: 37.5 % — ABNORMAL LOW (ref 40.0–52.0)
HEMOGLOBIN: 13 g/dL (ref 13.0–18.0)
Lymphocytes Relative: 14 %
Lymphs Abs: 0.3 10*3/uL — ABNORMAL LOW (ref 1.0–3.6)
MCH: 33.3 pg (ref 26.0–34.0)
MCHC: 34.6 g/dL (ref 32.0–36.0)
MCV: 96.2 fL (ref 80.0–100.0)
Monocytes Absolute: 0.6 10*3/uL (ref 0.2–1.0)
Monocytes Relative: 23 %
NEUTROS PCT: 61 %
Neutro Abs: 1.5 10*3/uL (ref 1.4–6.5)
Platelets: 230 10*3/uL (ref 150–440)
RBC: 3.9 MIL/uL — ABNORMAL LOW (ref 4.40–5.90)
RDW: 16.3 % — ABNORMAL HIGH (ref 11.5–14.5)
WBC: 2.4 10*3/uL — AB (ref 3.8–10.6)

## 2015-04-07 MED ORDER — HEPARIN SOD (PORK) LOCK FLUSH 100 UNIT/ML IV SOLN
INTRAVENOUS | Status: AC
  Filled 2015-04-07: qty 5

## 2015-04-08 ENCOUNTER — Ambulatory Visit
Admission: RE | Admit: 2015-04-08 | Discharge: 2015-04-08 | Disposition: A | Discharge status: Home / Self Care | Payer: Commercial Managed Care - PPO | Source: Ambulatory Visit | Attending: Radiation Oncology | Admitting: Radiation Oncology

## 2015-04-08 DIAGNOSIS — Z510 Encounter for antineoplastic radiation therapy: Secondary | ICD-10-CM | POA: Diagnosis not present

## 2015-04-16 ENCOUNTER — Other Ambulatory Visit: Payer: Self-pay

## 2015-04-16 ENCOUNTER — Inpatient Hospital Stay: Payer: Commercial Managed Care - PPO | Attending: Internal Medicine

## 2015-04-16 DIAGNOSIS — C01 Malignant neoplasm of base of tongue: Secondary | ICD-10-CM

## 2015-04-16 DIAGNOSIS — I10 Essential (primary) hypertension: Secondary | ICD-10-CM | POA: Diagnosis not present

## 2015-04-16 DIAGNOSIS — R070 Pain in throat: Secondary | ICD-10-CM | POA: Insufficient documentation

## 2015-04-16 DIAGNOSIS — Z87891 Personal history of nicotine dependence: Secondary | ICD-10-CM | POA: Diagnosis not present

## 2015-04-16 DIAGNOSIS — K219 Gastro-esophageal reflux disease without esophagitis: Secondary | ICD-10-CM | POA: Diagnosis not present

## 2015-04-16 DIAGNOSIS — Z923 Personal history of irradiation: Secondary | ICD-10-CM | POA: Insufficient documentation

## 2015-04-16 DIAGNOSIS — Z79899 Other long term (current) drug therapy: Secondary | ICD-10-CM | POA: Diagnosis not present

## 2015-04-16 DIAGNOSIS — Z9221 Personal history of antineoplastic chemotherapy: Secondary | ICD-10-CM | POA: Insufficient documentation

## 2015-04-16 DIAGNOSIS — R5383 Other fatigue: Secondary | ICD-10-CM | POA: Diagnosis not present

## 2015-04-16 DIAGNOSIS — E119 Type 2 diabetes mellitus without complications: Secondary | ICD-10-CM | POA: Insufficient documentation

## 2015-04-16 LAB — CBC WITH DIFFERENTIAL/PLATELET
BASOS ABS: 0 10*3/uL (ref 0–0.1)
Basophils Relative: 1 %
EOS PCT: 2 %
Eosinophils Absolute: 0.1 10*3/uL (ref 0–0.7)
HEMATOCRIT: 36.6 % — AB (ref 40.0–52.0)
Hemoglobin: 12.8 g/dL — ABNORMAL LOW (ref 13.0–18.0)
LYMPHS ABS: 0.4 10*3/uL — AB (ref 1.0–3.6)
Lymphocytes Relative: 12 %
MCH: 33.9 pg (ref 26.0–34.0)
MCHC: 35 g/dL (ref 32.0–36.0)
MCV: 96.7 fL (ref 80.0–100.0)
MONO ABS: 0.5 10*3/uL (ref 0.2–1.0)
MONOS PCT: 17 %
Neutro Abs: 2.1 10*3/uL (ref 1.4–6.5)
Neutrophils Relative %: 68 %
PLATELETS: 239 10*3/uL (ref 150–440)
RBC: 3.78 MIL/uL — ABNORMAL LOW (ref 4.40–5.90)
RDW: 17.8 % — AB (ref 11.5–14.5)
WBC: 3.1 10*3/uL — ABNORMAL LOW (ref 3.8–10.6)

## 2015-04-16 LAB — BASIC METABOLIC PANEL
ANION GAP: 9 (ref 5–15)
BUN: 50 mg/dL — ABNORMAL HIGH (ref 6–20)
CO2: 30 mmol/L (ref 22–32)
Calcium: 8.8 mg/dL — ABNORMAL LOW (ref 8.9–10.3)
Chloride: 95 mmol/L — ABNORMAL LOW (ref 101–111)
Creatinine, Ser: 1.3 mg/dL — ABNORMAL HIGH (ref 0.61–1.24)
GFR, EST NON AFRICAN AMERICAN: 56 mL/min — AB (ref >60)
GLUCOSE: 99 mg/dL (ref 65–99)
Potassium: 4.5 mmol/L (ref 3.5–5.1)
Sodium: 134 mmol/L — ABNORMAL LOW (ref 135–145)

## 2015-04-23 ENCOUNTER — Inpatient Hospital Stay: Payer: Commercial Managed Care - PPO

## 2015-04-23 DIAGNOSIS — C01 Malignant neoplasm of base of tongue: Secondary | ICD-10-CM | POA: Diagnosis not present

## 2015-04-23 LAB — BASIC METABOLIC PANEL WITH GFR
Anion gap: 6 (ref 5–15)
BUN: 32 mg/dL — ABNORMAL HIGH (ref 6–20)
CO2: 30 mmol/L (ref 22–32)
Calcium: 8.7 mg/dL — ABNORMAL LOW (ref 8.9–10.3)
Chloride: 96 mmol/L — ABNORMAL LOW (ref 101–111)
Creatinine, Ser: 1.17 mg/dL (ref 0.61–1.24)
GFR calc Af Amer: >60 mL/min
GFR calc non Af Amer: >60 mL/min
Glucose, Bld: 127 mg/dL — ABNORMAL HIGH (ref 65–99)
Potassium: 3.9 mmol/L (ref 3.5–5.1)
Sodium: 132 mmol/L — ABNORMAL LOW (ref 135–145)

## 2015-05-12 ENCOUNTER — Ambulatory Visit
Admission: RE | Admit: 2015-05-12 | Discharge: 2015-05-12 | Disposition: A | Discharge status: Home / Self Care | Payer: Commercial Managed Care - PPO | Source: Ambulatory Visit | Attending: Internal Medicine | Admitting: Internal Medicine

## 2015-05-12 DIAGNOSIS — C01 Malignant neoplasm of base of tongue: Secondary | ICD-10-CM | POA: Insufficient documentation

## 2015-05-12 HISTORY — DX: Malignant (primary) neoplasm, unspecified: C80.1

## 2015-05-12 MED ORDER — IOHEXOL 300 MG/ML  SOLN
75.0000 mL | Freq: Once | INTRAMUSCULAR | Status: AC | PRN
  Administered 2015-05-12: 75 mL via INTRAVENOUS

## 2015-05-13 ENCOUNTER — Inpatient Hospital Stay (HOSPITAL_BASED_OUTPATIENT_CLINIC_OR_DEPARTMENT_OTHER): Payer: Commercial Managed Care - PPO | Admitting: Internal Medicine

## 2015-05-13 ENCOUNTER — Encounter: Payer: Self-pay | Admitting: Radiation Oncology

## 2015-05-13 ENCOUNTER — Ambulatory Visit
Admission: RE | Admit: 2015-05-13 | Discharge: 2015-05-13 | Disposition: A | Discharge status: Home / Self Care | Payer: Commercial Managed Care - PPO | Source: Ambulatory Visit | Attending: Radiation Oncology | Admitting: Radiation Oncology

## 2015-05-13 VITALS — BP 109/55 | HR 71 | Temp 98.4°F | Resp 18 | Wt 185.5 lb

## 2015-05-13 DIAGNOSIS — C01 Malignant neoplasm of base of tongue: Secondary | ICD-10-CM | POA: Diagnosis not present

## 2015-05-13 DIAGNOSIS — Z923 Personal history of irradiation: Secondary | ICD-10-CM | POA: Diagnosis not present

## 2015-05-13 DIAGNOSIS — R5383 Other fatigue: Secondary | ICD-10-CM | POA: Diagnosis not present

## 2015-05-13 DIAGNOSIS — Z9221 Personal history of antineoplastic chemotherapy: Secondary | ICD-10-CM | POA: Diagnosis not present

## 2015-05-13 DIAGNOSIS — I10 Essential (primary) hypertension: Secondary | ICD-10-CM

## 2015-05-13 DIAGNOSIS — Z87891 Personal history of nicotine dependence: Secondary | ICD-10-CM

## 2015-05-13 DIAGNOSIS — E119 Type 2 diabetes mellitus without complications: Secondary | ICD-10-CM

## 2015-05-13 DIAGNOSIS — R070 Pain in throat: Secondary | ICD-10-CM

## 2015-05-13 DIAGNOSIS — Z79899 Other long term (current) drug therapy: Secondary | ICD-10-CM

## 2015-05-13 DIAGNOSIS — K219 Gastro-esophageal reflux disease without esophagitis: Secondary | ICD-10-CM

## 2015-05-13 NOTE — Progress Notes (Signed)
Radiation Oncology Follow up Note  Name: Manuel Buck   Date:   05/13/2015 MRN:  [id number] DOB: 1951-08-18    This 64 y.o. male presents to the clinic today for follow-up of head and neck cancer base of tongue.  REFERRING PROVIDER: Kirk Ruths, MD  HPI: Patient is a 64 year old male initially presented with increasing dysphagia and a lump in his throat found to have palpable adenopathy in the left high cervical cervical chain biopsy was positive of that note for poorly differentiated squamous cell carcinoma. Tumor was localized in the base of tongue which was hypermetabolic on PET/CT. He underwent concurrent chemotherapy and I MRT radiation therapy and is now seen 1 month out he is doing well only complaint still is lack of taste which is slowly improving he specifically denies dysphagia or head and neck pain. Had a CT scan this week showing excellent resolution of the abnormality in the space of tongue. Also had marked diminution in size of his left high cervical node consistent with excellent treatment response..  COMPLICATIONS OF TREATMENT: none  FOLLOW UP COMPLIANCE: keeps appointments   PHYSICAL EXAM:  BP 109/55 mmHg  Pulse 71  Temp(Src) 98.4 F (36.9 C)  Resp 18  Wt 185 lb 8.3 oz (84.15 kg)  on physical examination oral cavity is clear. No oral mucosal lesions are identified. Indirect mirror examination shows normal base of tongue. Vallecula and upper airway clear. Still slight less than 1 cm node in the left high cervical cervical chain. No other adenopathy is detected. Well-developed well-nourished patient in NAD. HEENT reveals PERLA, EOMI, discs not visualized.  Oral cavity is clear. No oral mucosal lesions are identified. Neck is clear without evidence of cervical or supraclavicular adenopathy. Lungs are clear to A&P. Cardiac examination is essentially unremarkable with regular rate and rhythm without murmur rub or thrill. Abdomen is benign with no organomegaly or  masses noted. Motor sensory and DTR levels are equal and symmetric in the upper and lower extremities. Cranial nerves II through XII are grossly intact. Proprioception is intact. No peripheral adenopathy or edema is identified. No motor or sensory levels are noted. Crude visual fields are within normal range.   RADIOLOGY RESULTS:  serial CT scans are reviewed including current 1 taken this week.   PLAN: Present time patient is doing well with excellent response by CT criteria. I've assured in his taste will return in the next several months. He or has follow-up appointments with ENT and I have asked to see him back in 4 months for routine follow-up. Patient is to call sooner with any concerns.  I would like to take this opportunity for allowing me to participate in the care of your patient.Armstead Peaks., MD  I

## 2015-05-14 ENCOUNTER — Ambulatory Visit: Payer: Commercial Managed Care - PPO | Admitting: Radiation Oncology

## 2015-05-28 NOTE — Progress Notes (Signed)
[place]  Telephone:(336) [phone] Fax:(336) [phone]     ID: Manuel Buck OB: [birth year]  MR#: [id number]  [id number]#:[id number]  Patient Care Team: Kirk Ruths, MD as PCP - General (Internal Medicine)  CHIEF COMPLAINT/DIAGNOSIS:  Invasive poorly differentiated squamous cell carcinoma of the left base of the tongue, Clinical stage II (T2, N1, M0). Diagnosed by biopsy done by Dr. Richardson Landry on 01/22/2015. PET scan on 01/17/2015 reports hypermetabolic mass left tongue base (2 cm on CT scan), adjacent level 2/3 necrotic-appearing adenopathy (3.2 cm on CT scan).  - patient completed radiation and concurrent cisplatin chemotherapy.   HISTORY OF PRESENT ILLNESS:  Patient returns for continued oncology evaluation, he recently completed chemoradiation. States that the fatigability is improving well. Denies any active complaints. Swallowing and eating well, soreness of the throat is continuing to improve. Denies any dizziness on getting up or ambulating. No nausea vomiting or diarrhea. He denies any new dyspnea, cough, hemoptysis or chest pain. No new headaches, imbalance, falls or seizures. No pain issues, 0-3/10. No fevers.  REVIEW OF SYSTEMS:   ROS As in HPI above. In addition, no fevers. No new headaches or focal weakness.  No new cough, shortness of breath, sputum, hemoptysis or chest pain. No dizziness or palpitation. No abdominal pain, constipation, diarrhea, dysuria or hematuria. No new skin rash or bleeding symptoms. No new paresthesias in extremities. No polyuria polydipsia. PS ECOG 1.  PAST MEDICAL HISTORY: Reviewed. Past Medical History  Diagnosis Date  . Hypertension   . GERD (gastroesophageal reflux disease)   . Skipped heart beats     occassionally-pt asymptomatic 01-21-2015)  . Diabetes mellitus without complication     Diet Controlled  . Cancer     tongue cancer 2016    PAST SURGICAL HISTORY: Reviewed. Past Surgical History  Procedure Laterality Date    . Nasal endoscopy    . Shoulder arthroscopy Right   . Skin graft    . Ankle fracture surgery    . Tonsillectomy    . Direct laryngoscopy N/A 01/22/2015    Procedure: DIRECT LARYNGOSCOPY/LARYNGOSCOPY WITH BIOPSY;  Surgeon: Clyde Canterbury, MD;  Location: [facility] ORS;  Service: ENT;  Laterality: N/A;    FAMILY HISTORY: Reviewed. Remarkable for diabetes, hypertension. Denies malignancy.  SOCIAL HISTORY: Reviewed. Social History  Substance Use Topics  . Smoking status: Former Smoker    Quit date: 01/20/1994  . Smokeless tobacco: Not on file  . Alcohol Use: No    Allergies  Allergen Reactions  . Statins Other (See Comments) and Rash  . Sulfa Antibiotics Rash    Current Outpatient Prescriptions  Medication Sig Dispense Refill  . acetaminophen (TYLENOL) 500 MG tablet Take 500 mg by mouth every 6 (six) hours as needed for mild pain or moderate pain.    . cyanocobalamin 500 MCG tablet Take 500 mcg by mouth daily.    Marland Kitchen dexamethasone (DECADRON) 4 MG tablet Take 4 mg daily x 7 days;  1/2 tablet x 7 days ; 1/2 tablet every other day until prescription complete (Patient not taking: Reported on 05/13/2015) 14 tablet 0  . lisinopril (PRINIVIL,ZESTRIL) 20 MG tablet Take 20 mg by mouth daily.    . MULTIPLE VITAMIN PO Take 1 tablet by mouth.    . promethazine (PHENERGAN) 25 MG tablet Take 1 tablet (25 mg total) by mouth every 6 (six) hours as needed for nausea or vomiting. 60 tablet 2  . sucralfate (CARAFATE) 1 G tablet Take 1 tablet (1 g total) by mouth 4 (  four) times daily -  with meals and at bedtime. (Patient not taking: Reported on 05/13/2015) 90 tablet 2   No current facility-administered medications for this visit.   Facility-Administered Medications Ordered in Other Visits  Medication Dose Route Frequency Provider Last Rate Last Dose  . sodium chloride 0.9 % injection 10 mL  10 mL Intracatheter PRN Leia Alf, MD        PHYSICAL EXAM: ECOG FS:1 - Symptomatic but completely  ambulatory GENERAL: Patient is alert and oriented and in no acute distress. There is no icterus. HEENT: EOMs intact. Oral exam negative for thrush or lesions. No palpable cervical lymphadenopathy, only induration. CVS: S1S2, regular LUNGS: Bilaterally clear to auscultation, no rhonchi. ABDOMEN: Soft, nontender. No hepatomegaly clinically.  NEURO: grossly nonfocal, cranial nerves are intact.   EXTREMITIES: No pedal edema.  LAB RESULTS:    Component Value Date/Time   NA 132* 04/23/2015 0857   K 3.9 04/23/2015 0857   CL 96* 04/23/2015 0857   CO2 30 04/23/2015 0857   GLUCOSE 127* 04/23/2015 0857   BUN 32* 04/23/2015 0857   CREATININE 1.17 04/23/2015 0857   CALCIUM 8.7* 04/23/2015 0857   PROT 6.6 03/18/2015 0833   ALBUMIN 3.7 03/18/2015 0833   AST 32 03/18/2015 0833   ALT 34 03/18/2015 0833   ALKPHOS 51 03/18/2015 0833   BILITOT 0.9 03/18/2015 0833   GFRNONAA >60 04/23/2015 0857   GFRAA >60 04/23/2015 0857   Lab Results  Component Value Date   WBC 3.1* 04/16/2015   NEUTROABS 2.1 04/16/2015   HGB 12.8* 04/16/2015   HCT 36.6* 04/16/2015   MCV 96.7 04/16/2015   PLT 239 04/16/2015     STUDIES: 12/31/14 - CT Neck. IMPRESSION: Palpable abnormality corresponds to an abnormal left level IIa lymph node measuring up to 3.2 cm. Small but asymmetric nearby left level 2 and 3 lymph nodes. Subtle associated left base of tongue mass estimated at 2 cm (series 2, image 38). Constellation most compatible with oropharyngeal carcinoma with imaging stage T1 N2a.  05/12/15 - CT Neck. IMPRESSION:  Normalization of the soft tissues and visible mucosal surfaces in the region of the previous LEFT base of tongue primary neoplasm. Involution of the previously observed pathologic adenopathy except for residual enlargement of a LEFT level IIA node, 12 x 13 mm cross-section, also improved. No new findings.   ASSESSMENT / PLAN:   1. Stage II (clinical T2N1M0) invasive poorly differentiated squamous cell  carcinoma of the left base of the tongue on biopsy done by Dr. Richardson Landry on 01/22/2015. PET scan on 01/17/2015 reports hypermetabolic mass left tongue base (2 cm on CT scan), adjacent level 2/3 necrotic-appearing adenopathy (3.2 cm on CT scan). Clinical stage II (T2, N1, M0).Started radiation on 02/11/15, alongwith, concurrent cisplatin chemotherapy  - reviewed labs from today and the CT scan of the neck done yesterday and d/w patient. I have explained that CT scan shows progressive treatment, left neck lymph node is now about a 12 x 13 mm but also improved. Plan is continued surveillance, will schedule him for a posttreatment PET scan in early January 2017 and see him back after it is done and make continued plan of management.       2. Recent mild AST elevation of 90 - LFT WNL. Will monitor upon next visit here .      3. Nutrition - encouraged patient to increase boost/ensure supplement to 3 times daily and to take frequent snacks in between meals.  4. In between visits, the patient has been advised to call or come to the ER in case of fevers, bleeding, acute sickness or new symptoms. Patient is agreeable to this plan.    Leia Alf, MD   05/28/2015 8:45 PM

## 2015-07-03 ENCOUNTER — Telehealth: Payer: Self-pay | Admitting: *Deleted

## 2015-07-03 NOTE — Telephone Encounter (Signed)
Notified patient of new appointment with Dr. Baruch Gouty at the request of Dr. Richardson Landry.  11/3/16at 900am

## 2015-07-03 NOTE — Telephone Encounter (Signed)
Order sent to schedulers to change appt adn left message for Horris Latino on her VM

## 2015-07-03 NOTE — Telephone Encounter (Signed)
Asking that the PET scan be moved up to the end of this month or beginning of next. He has a lymoh node on the left neck he is concerned about. Per Dr Baruch Gouty, ok to move PET Scan up and to schedule fu with him within a few days after the scan is done

## 2015-07-14 ENCOUNTER — Ambulatory Visit
Admission: RE | Admit: 2015-07-14 | Discharge: 2015-07-14 | Disposition: A | Discharge status: Home / Self Care | Payer: Commercial Managed Care - PPO | Source: Ambulatory Visit | Attending: Internal Medicine | Admitting: Internal Medicine

## 2015-07-14 DIAGNOSIS — Z85810 Personal history of malignant neoplasm of tongue: Secondary | ICD-10-CM | POA: Diagnosis not present

## 2015-07-14 DIAGNOSIS — Z08 Encounter for follow-up examination after completed treatment for malignant neoplasm: Secondary | ICD-10-CM | POA: Insufficient documentation

## 2015-07-14 DIAGNOSIS — C01 Malignant neoplasm of base of tongue: Secondary | ICD-10-CM

## 2015-07-14 DIAGNOSIS — I2510 Atherosclerotic heart disease of native coronary artery without angina pectoris: Secondary | ICD-10-CM | POA: Diagnosis not present

## 2015-07-14 LAB — GLUCOSE, CAPILLARY: Glucose-Capillary: 60 mg/dL — ABNORMAL LOW (ref 65–99)

## 2015-07-14 MED ORDER — FLUDEOXYGLUCOSE F - 18 (FDG) INJECTION
12.2400 | Freq: Once | INTRAVENOUS | Status: DC | PRN
  Administered 2015-07-14: 12.24 via INTRAVENOUS
  Filled 2015-07-14: qty 12.24

## 2015-07-17 ENCOUNTER — Other Ambulatory Visit: Payer: Self-pay | Admitting: *Deleted

## 2015-07-17 ENCOUNTER — Encounter: Payer: Self-pay | Admitting: Radiation Oncology

## 2015-07-17 ENCOUNTER — Ambulatory Visit
Admission: RE | Admit: 2015-07-17 | Discharge: 2015-07-17 | Disposition: A | Discharge status: Home / Self Care | Payer: Commercial Managed Care - PPO | Source: Ambulatory Visit | Attending: Radiation Oncology | Admitting: Radiation Oncology

## 2015-07-17 VITALS — BP 118/67 | HR 81 | Temp 97.1°F | Resp 18 | Wt 170.0 lb

## 2015-07-17 DIAGNOSIS — C01 Malignant neoplasm of base of tongue: Secondary | ICD-10-CM

## 2015-07-17 MED ORDER — PILOCARPINE HCL 5 MG PO TABS: 5.0000 mg | ORAL_TABLET | Freq: Four times a day (QID) | ORAL | Status: DC | PRN

## 2015-07-17 NOTE — Progress Notes (Signed)
Radiation Oncology Follow up Note  Name: Manuel Buck   Date:   07/17/2015 MRN:  [id number] DOB: May 28, 1951    This 64 y.o. male presents to the clinic today for follow-up for stage II (T2 N1 M0) poorly differentia squamous cell carcinoma the base of tongue status post concurrent chemoradiation.  REFERRING PROVIDER: Kirk Ruths, MD  HPI: Patient is a 64 year old male now out approximately 5 months having completed combined modality treatment with chemoradiation for a stage II poorly differentiated squamous cell carcinoma the base of tongue. He is seen today in routine follow-up major concerns complaint is dry mouth. Recently had a PET CT scan which shows excellent response to therapy with no evidence to suggest persistent disease. He is having some slight dysphasia mostly again based on his dry mouth..  COMPLICATIONS OF TREATMENT: present  FOLLOW UP COMPLIANCE: keeps appointments   PHYSICAL EXAM:  BP 118/67 mmHg  Pulse 81  Temp(Src) 97.1 F (36.2 C)  Resp 18  Wt 169 lb 15.6 oz (77.1 kg) Oral cavity is clear he does have minimal xerostomia. No oral mucosal lesions are identified indirect mirror examination shows upper airway clear vallecula and base of tongue within normal limits. Neck is clear without evidence of subject gastric cervical or supraclavicular adenopathy. Well-developed well-nourished patient in NAD. HEENT reveals PERLA, EOMI, discs not visualized.  Oral cavity is clear. No oral mucosal lesions are identified. Neck is clear without evidence of cervical or supraclavicular adenopathy. Lungs are clear to A&P. Cardiac examination is essentially unremarkable with regular rate and rhythm without murmur rub or thrill. Abdomen is benign with no organomegaly or masses noted. Motor sensory and DTR levels are equal and symmetric in the upper and lower extremities. Cranial nerves II through XII are grossly intact. Proprioception is intact. No peripheral adenopathy or edema is  identified. No motor or sensory levels are noted. Crude visual fields are within normal range.  RADIOLOGY RESULTS: PET CT scans are reviewed  PLAN: Present time he appears to be doing well with no evidence of disease by PET CT criteria. I'm starting him on pilocarpine 5 mg 4 times a day to help with his salivary function. I've asked to see him back in 6 months for follow-up. He continues close follow-up care with Dr. Richardson Buck. Patient is to call with any concerns.  I would like to take this opportunity for allowing me to participate in the care of your patient.Manuel Buck., MD

## 2015-08-14 ENCOUNTER — Ambulatory Visit: Payer: Commercial Managed Care - PPO

## 2015-09-11 ENCOUNTER — Other Ambulatory Visit: Payer: Self-pay | Admitting: Nurse Practitioner

## 2015-09-17 ENCOUNTER — Ambulatory Visit: Payer: Commercial Managed Care - PPO

## 2015-09-19 ENCOUNTER — Ambulatory Visit: Payer: Commercial Managed Care - PPO | Admitting: Radiation Oncology

## 2015-09-23 ENCOUNTER — Inpatient Hospital Stay: Payer: Commercial Managed Care - PPO

## 2015-09-23 ENCOUNTER — Other Ambulatory Visit: Payer: Self-pay | Admitting: *Deleted

## 2015-09-23 ENCOUNTER — Inpatient Hospital Stay: Payer: Commercial Managed Care - PPO | Attending: Internal Medicine

## 2015-09-23 ENCOUNTER — Ambulatory Visit: Payer: Commercial Managed Care - PPO | Admitting: Radiation Oncology

## 2015-09-23 DIAGNOSIS — I2510 Atherosclerotic heart disease of native coronary artery without angina pectoris: Secondary | ICD-10-CM | POA: Insufficient documentation

## 2015-09-23 DIAGNOSIS — E119 Type 2 diabetes mellitus without complications: Secondary | ICD-10-CM | POA: Insufficient documentation

## 2015-09-23 DIAGNOSIS — Z9221 Personal history of antineoplastic chemotherapy: Secondary | ICD-10-CM | POA: Insufficient documentation

## 2015-09-23 DIAGNOSIS — Z87891 Personal history of nicotine dependence: Secondary | ICD-10-CM | POA: Insufficient documentation

## 2015-09-23 DIAGNOSIS — C01 Malignant neoplasm of base of tongue: Secondary | ICD-10-CM

## 2015-09-23 DIAGNOSIS — K219 Gastro-esophageal reflux disease without esophagitis: Secondary | ICD-10-CM | POA: Insufficient documentation

## 2015-09-23 DIAGNOSIS — D72819 Decreased white blood cell count, unspecified: Secondary | ICD-10-CM | POA: Insufficient documentation

## 2015-09-23 DIAGNOSIS — R682 Dry mouth, unspecified: Secondary | ICD-10-CM | POA: Insufficient documentation

## 2015-09-23 DIAGNOSIS — Z79899 Other long term (current) drug therapy: Secondary | ICD-10-CM | POA: Insufficient documentation

## 2015-09-23 DIAGNOSIS — Z923 Personal history of irradiation: Secondary | ICD-10-CM | POA: Insufficient documentation

## 2015-09-23 DIAGNOSIS — I10 Essential (primary) hypertension: Secondary | ICD-10-CM | POA: Insufficient documentation

## 2015-09-23 DIAGNOSIS — R439 Unspecified disturbances of smell and taste: Secondary | ICD-10-CM | POA: Insufficient documentation

## 2015-09-23 DIAGNOSIS — D649 Anemia, unspecified: Secondary | ICD-10-CM | POA: Insufficient documentation

## 2015-09-26 ENCOUNTER — Ambulatory Visit: Payer: Commercial Managed Care - PPO

## 2015-09-26 ENCOUNTER — Other Ambulatory Visit: Payer: Commercial Managed Care - PPO

## 2015-09-30 ENCOUNTER — Inpatient Hospital Stay: Payer: Commercial Managed Care - PPO

## 2015-09-30 ENCOUNTER — Encounter: Payer: Self-pay | Admitting: Internal Medicine

## 2015-09-30 ENCOUNTER — Inpatient Hospital Stay (HOSPITAL_BASED_OUTPATIENT_CLINIC_OR_DEPARTMENT_OTHER): Payer: Commercial Managed Care - PPO | Admitting: Internal Medicine

## 2015-09-30 VITALS — BP 105/63 | HR 56 | Temp 97.2°F | Resp 18 | Wt 171.3 lb

## 2015-09-30 DIAGNOSIS — Z79899 Other long term (current) drug therapy: Secondary | ICD-10-CM | POA: Diagnosis not present

## 2015-09-30 DIAGNOSIS — C01 Malignant neoplasm of base of tongue: Secondary | ICD-10-CM | POA: Diagnosis not present

## 2015-09-30 DIAGNOSIS — Z923 Personal history of irradiation: Secondary | ICD-10-CM

## 2015-09-30 DIAGNOSIS — D72819 Decreased white blood cell count, unspecified: Secondary | ICD-10-CM | POA: Diagnosis not present

## 2015-09-30 DIAGNOSIS — I2510 Atherosclerotic heart disease of native coronary artery without angina pectoris: Secondary | ICD-10-CM

## 2015-09-30 DIAGNOSIS — I10 Essential (primary) hypertension: Secondary | ICD-10-CM | POA: Diagnosis not present

## 2015-09-30 DIAGNOSIS — D649 Anemia, unspecified: Secondary | ICD-10-CM

## 2015-09-30 DIAGNOSIS — R439 Unspecified disturbances of smell and taste: Secondary | ICD-10-CM | POA: Diagnosis not present

## 2015-09-30 DIAGNOSIS — E119 Type 2 diabetes mellitus without complications: Secondary | ICD-10-CM

## 2015-09-30 DIAGNOSIS — Z9221 Personal history of antineoplastic chemotherapy: Secondary | ICD-10-CM

## 2015-09-30 DIAGNOSIS — Z87891 Personal history of nicotine dependence: Secondary | ICD-10-CM

## 2015-09-30 DIAGNOSIS — R682 Dry mouth, unspecified: Secondary | ICD-10-CM | POA: Diagnosis not present

## 2015-09-30 DIAGNOSIS — K219 Gastro-esophageal reflux disease without esophagitis: Secondary | ICD-10-CM | POA: Diagnosis not present

## 2015-09-30 LAB — CBC WITH DIFFERENTIAL/PLATELET
Basophils Absolute: 0 10*3/uL (ref 0–0.1)
Basophils Relative: 1 %
EOS ABS: 0.1 10*3/uL (ref 0–0.7)
EOS PCT: 3 %
HCT: 34.5 % — ABNORMAL LOW (ref 40.0–52.0)
Hemoglobin: 11.6 g/dL — ABNORMAL LOW (ref 13.0–18.0)
LYMPHS ABS: 0.7 10*3/uL — AB (ref 1.0–3.6)
LYMPHS PCT: 22 %
MCH: 33.2 pg (ref 26.0–34.0)
MCHC: 33.7 g/dL (ref 32.0–36.0)
MCV: 98.7 fL (ref 80.0–100.0)
MONO ABS: 0.4 10*3/uL (ref 0.2–1.0)
MONOS PCT: 14 %
Neutro Abs: 1.9 10*3/uL (ref 1.4–6.5)
Neutrophils Relative %: 60 %
PLATELETS: 259 10*3/uL (ref 150–440)
RBC: 3.5 MIL/uL — ABNORMAL LOW (ref 4.40–5.90)
RDW: 13.6 % (ref 11.5–14.5)
WBC: 3.2 10*3/uL — ABNORMAL LOW (ref 3.8–10.6)

## 2015-09-30 LAB — COMPREHENSIVE METABOLIC PANEL
ALBUMIN: 4.2 g/dL (ref 3.5–5.0)
ALT: 36 U/L (ref 17–63)
AST: 41 U/L (ref 15–41)
Alkaline Phosphatase: 50 U/L (ref 38–126)
Anion gap: 6 (ref 5–15)
BUN: 26 mg/dL — AB (ref 6–20)
CHLORIDE: 95 mmol/L — AB (ref 101–111)
CO2: 31 mmol/L (ref 22–32)
CREATININE: 0.87 mg/dL (ref 0.61–1.24)
Calcium: 9.2 mg/dL (ref 8.9–10.3)
GFR calc Af Amer: >60 mL/min (ref >60)
GLUCOSE: 114 mg/dL — AB (ref 65–99)
POTASSIUM: 3.9 mmol/L (ref 3.5–5.1)
Sodium: 132 mmol/L — ABNORMAL LOW (ref 135–145)
Total Bilirubin: 0.9 mg/dL (ref 0.3–1.2)
Total Protein: 6.9 g/dL (ref 6.5–8.1)

## 2015-09-30 LAB — FERRITIN: FERRITIN: 221 ng/mL (ref 24–336)

## 2015-09-30 NOTE — Progress Notes (Signed)
Survivorship Care Plan visit completed.  Treatment summary reviewed and given to patient.  ASCO answers booklet reviewed and given to patient.  CARE program and Cancer Transitions discussed along with other resources provided by the cancer center.   Patient verbalized understanding.

## 2015-09-30 NOTE — Progress Notes (Signed)
[place]  Telephone:(336) [phone] Fax:(336) [phone]     ID: CRIAG FURNISH OB: 13-Feb-1951  MR#: [id number]  [id number]  Patient Care Team: Kirk Ruths, MD as PCP - General (Internal Medicine)  CHIEF COMPLAINT/DIAGNOSIS:  Invasive poorly differentiated squamous cell carcinoma of the left base of the tongue, Clinical stage II (T2, N1, M0). Diagnosed by biopsy done by Dr. Richardson Landry on 01/22/2015. PET scan on 01/17/2015 reports hypermetabolic mass left tongue base (2 cm on CT scan), adjacent level 2/3 necrotic-appearing adenopathy (3.2 cm on CT scan).  - patient completed radiation and concurrent cisplatin chemotherapy  In July 2016  HISTORY OF PRESENT ILLNESS:  Mr. Manuel Buck returns to our clinic for follow-up visit. He has done well since his last appointment. He still complains of dry mouth and loss of taste, but his appetite is decent, and he is able to eat for meals. His energy level is good, and he is able to work full time, without any significant restrictions. He was found to have mild anemia recently, and was started on ferrous sulfate 1 tablet today.  REVIEW OF SYSTEMS:   ROS As in HPI above. In addition, no fevers. No new headaches or focal weakness.  No new cough, shortness of breath, sputum, hemoptysis or chest pain. No dizziness or palpitation. No abdominal pain, constipation, diarrhea, dysuria or hematuria. No new skin rash or bleeding symptoms. No new paresthesias in extremities. No polyuria polydipsia. PS ECOG 1.  PAST MEDICAL HISTORY: Reviewed. Past Medical History  Diagnosis Date  . Hypertension   . GERD (gastroesophageal reflux disease)   . Skipped heart beats     occassionally-pt asymptomatic 01-21-2015)  . Diabetes mellitus without complication ([place])     Diet Controlled  . Cancer ([place])     tongue cancer 2016    PAST SURGICAL HISTORY: Reviewed. Past Surgical History  Procedure Laterality Date  . Nasal endoscopy    . Shoulder  arthroscopy Right   . Skin graft    . Ankle fracture surgery    . Tonsillectomy    . Direct laryngoscopy N/A 01/22/2015    Procedure: DIRECT LARYNGOSCOPY/LARYNGOSCOPY WITH BIOPSY;  Surgeon: Clyde Canterbury, MD;  Location: [facility] ORS;  Service: ENT;  Laterality: N/A;    FAMILY HISTORY: Reviewed. Remarkable for diabetes, hypertension. Denies malignancy.  SOCIAL HISTORY: Reviewed. Social History  Substance Use Topics  . Smoking status: Former Smoker    Quit date: 01/20/1994  . Smokeless tobacco: Not on file  . Alcohol Use: No    Allergies  Allergen Reactions  . Statins Other (See Comments) and Rash  . Sulfa Antibiotics Rash    Current Outpatient Prescriptions  Medication Sig Dispense Refill  . acetaminophen (TYLENOL) 500 MG tablet Take 500 mg by mouth every 6 (six) hours as needed for mild pain or moderate pain.    . cyanocobalamin 500 MCG tablet Take 500 mcg by mouth daily.    Marland Kitchen dexamethasone (DECADRON) 4 MG tablet Take 4 mg daily x 7 days;  1/2 tablet x 7 days ; 1/2 tablet every other day until prescription complete (Patient not taking: Reported on 07/17/2015) 14 tablet 0  . lisinopril-hydrochlorothiazide (PRINZIDE,ZESTORETIC) 10-12.5 MG tablet Take by mouth.    . MULTIPLE VITAMIN PO Take 1 tablet by mouth.    . pilocarpine (SALAGEN) 5 MG tablet Take 1 tablet (5 mg total) by mouth 4 (four) times daily as needed (saliva production). 60 tablet 0  . promethazine (PHENERGAN) 25 MG tablet Take 1 tablet (25 mg  total) by mouth every 6 (six) hours as needed for nausea or vomiting. (Patient not taking: Reported on 07/17/2015) 60 tablet 2  . sucralfate (CARAFATE) 1 G tablet Take 1 tablet (1 g total) by mouth 4 (four) times daily -  with meals and at bedtime. (Patient not taking: Reported on 07/17/2015) 90 tablet 2   No current facility-administered medications for this visit.    PHYSICAL EXAM: ECOG FS:0 - Asymptomatic GENERAL: Patient is alert and oriented and in no acute distress. There is no  icterus. HEENT: EOMs intact. Oral exam negative for thrush or lesions. No palpable cervical lymphadenopathy, only minimal induration. CVS: S1S2, regular LUNGS: Bilaterally clear to auscultation, no rhonchi. ABDOMEN: Soft, nontender. No hepatomegaly clinically.  NEURO: grossly nonfocal, cranial nerves are intact.   EXTREMITIES: No pedal edema.  LAB RESULTS:    Component Value Date/Time   NA 132* 09/30/2015 1444   K 3.9 09/30/2015 1444   CL 95* 09/30/2015 1444   CO2 31 09/30/2015 1444   GLUCOSE 114* 09/30/2015 1444   BUN 26* 09/30/2015 1444   CREATININE 0.87 09/30/2015 1444   CALCIUM 9.2 09/30/2015 1444   PROT 6.9 09/30/2015 1444   ALBUMIN 4.2 09/30/2015 1444   AST 41 09/30/2015 1444   ALT 36 09/30/2015 1444   ALKPHOS 50 09/30/2015 1444   BILITOT 0.9 09/30/2015 1444   GFRNONAA >60 09/30/2015 1444   GFRAA >60 09/30/2015 1444   Lab Results  Component Value Date   WBC 3.1* 04/16/2015   NEUTROABS 2.1 04/16/2015   HGB 12.8* 04/16/2015   HCT 36.6* 04/16/2015   MCV 96.7 04/16/2015   PLT 239 04/16/2015     STUDIES: 12/31/14 - CT Neck. IMPRESSION: Palpable abnormality corresponds to an abnormal left level IIa lymph node measuring up to 3.2 cm. Small but asymmetric nearby left level 2 and 3 lymph nodes. Subtle associated left base of tongue mass estimated at 2 cm (series 2, image 38). Constellation most compatible with oropharyngeal carcinoma with imaging stage T1 N2a.  05/12/15 - CT Neck. IMPRESSION:  Normalization of the soft tissues and visible mucosal surfaces in the region of the previous LEFT base of tongue primary neoplasm. Involution of the previously observed pathologic adenopathy except for residual enlargement of a LEFT level IIA node, 12 x 13 mm cross-section, also improved. No new findings.  CLINICAL DATA: Subsequent Treatment strategy for tongue base cancer. Restaging after treatment.  EXAM: NUCLEAR MEDICINE PET SKULL BASE TO THIGH  TECHNIQUE: Twelve point to mCi  F-18 FDG was injected intravenously. Full-ring PET imaging was performed from the skull base to thigh after the radiotracer. CT data was obtained and used for attenuation correction and anatomic localization.  FASTING BLOOD GLUCOSE: Value: 60 mg/dl  COMPARISON: PET-CT 01/17/2015.  FINDINGS: NECK  Previously noted hypermetabolic lesion in the base of the tongue is no longer visualized. There is some symmetric low-level hypermetabolic activity associated with the base of the tongue (SUVmax = 6.0), which appears symmetric, and consistent with normal physiologic activity. No residual hypermetabolic lymphadenopathy in the cervical region on today's examination.  CHEST  No hypermetabolic mediastinal or hilar nodes. No suspicious pulmonary nodules on the CT scan. There is atherosclerosis of the thoracic aorta, the great vessels of the mediastinum and the coronary arteries, including calcified atherosclerotic plaque in the left anterior descending, left circumflex and right coronary arteries.  ABDOMEN/PELVIS  There is a small amount of low-level hypermetabolism near the anorectal junction (SUVmax = 5.9), without discrete mass identified on today's noncontrast CT  examination. No abnormal hypermetabolic activity within the liver, pancreas, adrenal glands, or spleen. No hypermetabolic lymph nodes in the abdomen or pelvis. Atherosclerosis throughout the abdominal and pelvic vasculature, without definite aneurysm. No significant volume of ascites. No pneumoperitoneum. Postoperative changes of left inguinal herniorrhaphy.  SKELETON  Multiple old healed bilateral posterior rib fractures. No focal hypermetabolic activity to suggest skeletal metastasis.  IMPRESSION: 1. Today's study demonstrates a positive response to therapy with resolution of the previously noted base of tongue mass. There continues to be some low-level metabolic activity in the base of the tongue, which  is symmetric and favored be physiologic. Previously noted left-sided cervical lymphadenopathy has resolved. 2. No definite signs of metastatic disease in the abdomen or pelvis. 3. Low-level hypermetabolism in the region of the anal rectal junction, favored to be physiologic. However, correlation with physical examination is recommended to exclude the possibility of an anorectal mass. 4. Atherosclerosis, including three-vessel coronary artery disease. Please note that although the presence of coronary artery calcium documents the presence of coronary artery disease, the severity of this disease and any potential stenosis cannot be assessed on this non-gated CT examination. Assessment for potential risk factor modification, dietary therapy or pharmacologic therapy may be warranted, if clinically indicated.  I personally reviewed PET-CT images from 07/14/2015   ASSESSMENT / PLAN:   1. Stage II (clinical T2N1M0) invasive poorly differentiated squamous cell carcinoma of the left base of the tongue on biopsy done by Dr. Richardson Landry on 01/22/2015. PET scan on 01/17/2015 reports hypermetabolic mass left tongue base (2 cm on CT scan), adjacent level 2/3 necrotic-appearing adenopathy (3.2 cm on CT scan). Clinical stage II (T2, N1, M0).Started radiation on 02/11/15, alongwith, concurrent cisplatin chemotherapy  - completed in July 2016. Clinically patient has no evidence of persistent disease. Post treatment PET-CT showed resolution of hypermetabolic base of tongue mass and hypermetabolic left cervical lymphadenopathy. We should continue with every 3 month visits for at least another 6 months, at that point we can decide on whether to switch to every 6 months regimen. Taking into account excellent response to chemoradiation therapy, documented on the PET scan from October 2016, no need for additional imaging studies at this point. We may consider imaging if new symptoms or signs appear. We also discussed the  fact that given positivity of the tumor for p16, and, consequently HPV positivity of the tumor, she should have an excellent long-term prognosis. I also recommended Mrs. Gomer to discuss this information with her GYN specialist.  2. Leukopenia and anemia-the reason is somewhat unclear. There is a possibility of that these findings are delayed toxicities of chemotherapy, although cisplatin is not particularly myelotoxic. We shall look for signs of vitamin B12 and folate deficiency, since MCV is increasing. Patient has started iron supplementation, but it does not appear to be necessary, since clinical picture does not fit the description of iron deficiency, which is usually associated with microcytosis. Also, patient maintains a regular diet and does not drink alcohol.     Roxana Hires, MD   09/30/2015 2:48 PM

## 2015-10-01 LAB — FOLATE RBC
Folate, Hemolysate: 336.5 ng/mL
Folate, RBC: 1014 ng/mL (ref >498)
HEMATOCRIT: 33.2 % — AB (ref 37.5–51.0)

## 2015-10-01 LAB — VITAMIN B12: Vitamin B-12: 434 pg/mL (ref 180–914)

## 2015-10-29 NOTE — Progress Notes (Signed)
This encounter was created in error - please disregard.

## 2015-12-29 ENCOUNTER — Inpatient Hospital Stay: Payer: Commercial Managed Care - PPO | Attending: Family Medicine

## 2015-12-29 ENCOUNTER — Other Ambulatory Visit: Payer: Commercial Managed Care - PPO

## 2015-12-29 ENCOUNTER — Inpatient Hospital Stay: Payer: Commercial Managed Care - PPO | Admitting: Family Medicine

## 2015-12-29 ENCOUNTER — Ambulatory Visit: Payer: Commercial Managed Care - PPO

## 2016-02-05 ENCOUNTER — Ambulatory Visit: Payer: Commercial Managed Care - PPO | Admitting: Radiation Oncology

## 2016-02-06 ENCOUNTER — Ambulatory Visit
Admission: RE | Admit: 2016-02-06 | Discharge: 2016-02-06 | Disposition: A | Discharge status: Home / Self Care | Payer: Commercial Managed Care - PPO | Source: Ambulatory Visit | Attending: Radiation Oncology | Admitting: Radiation Oncology

## 2016-02-06 ENCOUNTER — Encounter: Payer: Self-pay | Admitting: Radiation Oncology

## 2016-02-06 ENCOUNTER — Other Ambulatory Visit: Payer: Self-pay | Admitting: *Deleted

## 2016-02-06 VITALS — BP 134/75 | HR 53 | Temp 95.6°F | Resp 20 | Wt 185.2 lb

## 2016-02-06 DIAGNOSIS — Z923 Personal history of irradiation: Secondary | ICD-10-CM | POA: Insufficient documentation

## 2016-02-06 DIAGNOSIS — Z9221 Personal history of antineoplastic chemotherapy: Secondary | ICD-10-CM | POA: Diagnosis not present

## 2016-02-06 DIAGNOSIS — R682 Dry mouth, unspecified: Secondary | ICD-10-CM | POA: Diagnosis not present

## 2016-02-06 DIAGNOSIS — R439 Unspecified disturbances of smell and taste: Secondary | ICD-10-CM | POA: Insufficient documentation

## 2016-02-06 DIAGNOSIS — C01 Malignant neoplasm of base of tongue: Secondary | ICD-10-CM

## 2016-02-06 DIAGNOSIS — Z85810 Personal history of malignant neoplasm of tongue: Secondary | ICD-10-CM | POA: Insufficient documentation

## 2016-02-24 ENCOUNTER — Inpatient Hospital Stay (HOSPITAL_BASED_OUTPATIENT_CLINIC_OR_DEPARTMENT_OTHER): Payer: Commercial Managed Care - PPO | Admitting: Family Medicine

## 2016-02-24 ENCOUNTER — Encounter: Payer: Self-pay | Admitting: Family Medicine

## 2016-02-24 ENCOUNTER — Inpatient Hospital Stay: Payer: Commercial Managed Care - PPO | Attending: Family Medicine

## 2016-02-24 ENCOUNTER — Other Ambulatory Visit: Payer: Self-pay | Admitting: Family Medicine

## 2016-02-24 VITALS — BP 138/70 | HR 78 | Temp 97.0°F | Ht 72.0 in | Wt 186.8 lb

## 2016-02-24 DIAGNOSIS — D509 Iron deficiency anemia, unspecified: Secondary | ICD-10-CM

## 2016-02-24 DIAGNOSIS — Z79899 Other long term (current) drug therapy: Secondary | ICD-10-CM

## 2016-02-24 DIAGNOSIS — Z9221 Personal history of antineoplastic chemotherapy: Secondary | ICD-10-CM | POA: Diagnosis not present

## 2016-02-24 DIAGNOSIS — I10 Essential (primary) hypertension: Secondary | ICD-10-CM

## 2016-02-24 DIAGNOSIS — E119 Type 2 diabetes mellitus without complications: Secondary | ICD-10-CM | POA: Insufficient documentation

## 2016-02-24 DIAGNOSIS — Z923 Personal history of irradiation: Secondary | ICD-10-CM | POA: Insufficient documentation

## 2016-02-24 DIAGNOSIS — C01 Malignant neoplasm of base of tongue: Secondary | ICD-10-CM

## 2016-02-24 DIAGNOSIS — R682 Dry mouth, unspecified: Secondary | ICD-10-CM | POA: Diagnosis not present

## 2016-02-24 DIAGNOSIS — Z87891 Personal history of nicotine dependence: Secondary | ICD-10-CM | POA: Insufficient documentation

## 2016-02-24 DIAGNOSIS — K219 Gastro-esophageal reflux disease without esophagitis: Secondary | ICD-10-CM | POA: Diagnosis not present

## 2016-02-24 DIAGNOSIS — D649 Anemia, unspecified: Secondary | ICD-10-CM | POA: Insufficient documentation

## 2016-02-24 DIAGNOSIS — Z85810 Personal history of malignant neoplasm of tongue: Secondary | ICD-10-CM | POA: Diagnosis present

## 2016-02-24 DIAGNOSIS — D72819 Decreased white blood cell count, unspecified: Secondary | ICD-10-CM

## 2016-02-24 LAB — COMPREHENSIVE METABOLIC PANEL
ALT: 45 U/L (ref 17–63)
ANION GAP: 7 (ref 5–15)
AST: 51 U/L — AB (ref 15–41)
Albumin: 3.9 g/dL (ref 3.5–5.0)
Alkaline Phosphatase: 49 U/L (ref 38–126)
BILIRUBIN TOTAL: 1.1 mg/dL (ref 0.3–1.2)
BUN: 28 mg/dL — ABNORMAL HIGH (ref 6–20)
CHLORIDE: 99 mmol/L — AB (ref 101–111)
CO2: 30 mmol/L (ref 22–32)
Calcium: 9.1 mg/dL (ref 8.9–10.3)
Creatinine, Ser: 1.23 mg/dL (ref 0.61–1.24)
GFR, EST NON AFRICAN AMERICAN: 59 mL/min — AB (ref >60)
Glucose, Bld: 136 mg/dL — ABNORMAL HIGH (ref 65–99)
POTASSIUM: 4 mmol/L (ref 3.5–5.1)
Sodium: 136 mmol/L (ref 135–145)
TOTAL PROTEIN: 6.7 g/dL (ref 6.5–8.1)

## 2016-02-24 LAB — CBC WITH DIFFERENTIAL/PLATELET
BASOS ABS: 0 10*3/uL (ref 0–0.1)
Basophils Relative: 1 %
EOS PCT: 5 %
Eosinophils Absolute: 0.2 10*3/uL (ref 0–0.7)
HCT: 35 % — ABNORMAL LOW (ref 40.0–52.0)
Hemoglobin: 12.4 g/dL — ABNORMAL LOW (ref 13.0–18.0)
LYMPHS PCT: 22 %
Lymphs Abs: 0.8 10*3/uL — ABNORMAL LOW (ref 1.0–3.6)
MCH: 34.1 pg — AB (ref 26.0–34.0)
MCHC: 35.3 g/dL (ref 32.0–36.0)
MCV: 96.4 fL (ref 80.0–100.0)
MONO ABS: 0.4 10*3/uL (ref 0.2–1.0)
MONOS PCT: 12 %
Neutro Abs: 2.3 10*3/uL (ref 1.4–6.5)
Neutrophils Relative %: 60 %
PLATELETS: 265 10*3/uL (ref 150–440)
RBC: 3.63 MIL/uL — ABNORMAL LOW (ref 4.40–5.90)
RDW: 13.4 % (ref 11.5–14.5)
WBC: 3.7 10*3/uL — ABNORMAL LOW (ref 3.8–10.6)

## 2016-02-24 LAB — IRON AND TIBC
IRON: 72 ug/dL (ref 45–182)
Saturation Ratios: 22 % (ref 17.9–39.5)
TIBC: 332 ug/dL (ref 250–450)
UIBC: 260 ug/dL

## 2016-02-24 LAB — VITAMIN B12: VITAMIN B 12: 618 pg/mL (ref 180–914)

## 2016-02-24 LAB — FERRITIN: FERRITIN: 174 ng/mL (ref 24–336)

## 2016-02-24 NOTE — Progress Notes (Signed)
Patient here for follow up. States he has been experiencing dry mouth. Dentist suggested Biotene which the patient is using.

## 2016-02-24 NOTE — Progress Notes (Signed)
[place]  [phone]  [phone]     Manuel Buck DOB: [birth year]  MR#: [id number]  VCB#:[id number]  Patient Care Team: Kirk Ruths, MD as PCP - General (Internal Medicine)  CHIEF COMPLAINT:  Chief Complaint  Patient presents with  . Anemia  . 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 Patient completed chemotherapy and radiation concurrently in July 2016. He overall reports feeling very well, he has some chronic dry mouth but is reports that even this is improving. He continues to follow his primary care provider as well as Dr. Richardson Landry, ENT. Patient reports having a good appetite and has been able to eat without any difficulty. He continues to take ferrous sulfate 1 tablet per day due to recent mild anemia. Otherwise patient denies any acute complaints.  REVIEW OF SYSTEMS:   Review of Systems  Constitutional: Negative for fever, chills, weight loss, malaise/fatigue and diaphoresis.  HENT: Negative.        Chronic dry mouth  Eyes: Negative.   Respiratory: Negative for cough, hemoptysis, sputum production, shortness of breath and wheezing.   Cardiovascular: Negative for chest pain, palpitations, orthopnea, claudication, leg swelling and PND.  Gastrointestinal: Negative for heartburn, nausea, vomiting, abdominal pain, diarrhea, constipation, blood in stool and melena.  Genitourinary: Negative.   Musculoskeletal: Negative.   Skin: Negative.   Neurological: Negative for dizziness, tingling, focal weakness, seizures and weakness.  Endo/Heme/Allergies: Does not bruise/bleed easily.  Psychiatric/Behavioral: Negative for depression. The patient is not nervous/anxious and does not have insomnia.     As per HPI. Otherwise, a complete review of systems is negatve.  ONCOLOGY HISTORY: Oncology History   Invasive poorly differentiated  squamous cell carcinoma of the left base of the tongue, Clinical stage II (T2, N1, M0). Diagnosed by biopsy done by Dr. Richardson Landry on 01/22/2015. PET scan on 01/17/2015 reports hypermetabolic mass left tongue base (2 cm on CT scan), adjacent level 2/3 necrotic-appearing adenopathy (3.2 cm on CT scan).  - patient completed radiation and concurrent cisplatin chemotherapy In July 2016     Malignant neoplasm of base of tongue ([place])   02/04/2015 Initial Diagnosis Malignant neoplasm of base of tongue ([facility])    PAST MEDICAL HISTORY: Past Medical History  Diagnosis Date  . Hypertension   . GERD (gastroesophageal reflux disease)   . Skipped heart beats     occassionally-pt asymptomatic 01-21-2015)  . Diabetes mellitus without complication ([place])     Diet Controlled  . Cancer ([place])     tongue cancer 2016    PAST SURGICAL HISTORY: Past Surgical History  Procedure Laterality Date  . Nasal endoscopy    . Shoulder arthroscopy Right   . Skin graft    . Ankle fracture surgery    . Tonsillectomy    . Direct laryngoscopy N/A 01/22/2015    Procedure: DIRECT LARYNGOSCOPY/LARYNGOSCOPY WITH BIOPSY;  Surgeon: Clyde Canterbury, MD;  Location: [facility] ORS;  Service: ENT;  Laterality: N/A;    FAMILY HISTORY History reviewed. No pertinent family history.  GYNECOLOGIC HISTORY:  No LMP for male patient.     ADVANCED DIRECTIVES:    HEALTH MAINTENANCE: Social History  Substance Use Topics  . Smoking status: Former Smoker    Quit date: 01/20/1994  . Smokeless tobacco: None  . Alcohol Use: No     Allergies  Allergen Reactions  . Statins Other (See Comments) and Rash  .  Sulfa Antibiotics Rash    Current Outpatient Prescriptions  Medication Sig Dispense Refill  . acetaminophen (TYLENOL) 500 MG tablet Take 500 mg by mouth every 6 (six) hours as needed for mild pain or moderate pain.    . cyanocobalamin 500 MCG tablet Take 500 mcg by mouth daily.    . ferrous sulfate 325 (65 FE) MG tablet Take 325 mg  by mouth daily with breakfast.    . lisinopril-hydrochlorothiazide (PRINZIDE,ZESTORETIC) 10-12.5 MG tablet Take by mouth.    . MULTIPLE VITAMIN PO Take 1 tablet by mouth.     No current facility-administered medications for this visit.    OBJECTIVE: BP 138/70 mmHg  Pulse 78  Temp(Src) 97 F (36.1 C) (Tympanic)  Ht 6' (1.829 m)  Wt 186 lb 12.8 oz (84.732 kg)  BMI 25.33 kg/m2   Body mass index is 25.33 kg/(m^2).    ECOG FS:0 - Asymptomatic  General: Well-developed, well-nourished, no acute distress. Eyes: Pink conjunctiva, anicteric sclera. HEENT: Normocephalic, moist mucous membranes, clear oropharnyx. Lungs: Clear to auscultation bilaterally. Heart: Regular rate and rhythm. No rubs, murmurs, or gallops. Abdomen: Soft, nontender, nondistended. No organomegaly noted, normoactive bowel sounds. Musculoskeletal: No edema, cyanosis, or clubbing. Neuro: Alert, answering all questions appropriately. Cranial nerves grossly intact. Skin: No rashes or petechiae noted. Psych: Normal affect. Lymphatics: No cervical, clavicular LAD.   LAB RESULTS:  Appointment on 02/24/2016  Component Date Value Ref Range Status  . WBC 02/24/2016 3.7* 3.8 - 10.6 K/uL Final  . RBC 02/24/2016 3.63* 4.40 - 5.90 MIL/uL Final  . Hemoglobin 02/24/2016 12.4* 13.0 - 18.0 g/dL Final  . HCT 02/24/2016 35.0* 40.0 - 52.0 % Final  . MCV 02/24/2016 96.4  80.0 - 100.0 fL Final  . MCH 02/24/2016 34.1* 26.0 - 34.0 pg Final  . MCHC 02/24/2016 35.3  32.0 - 36.0 g/dL Final  . RDW 02/24/2016 13.4  11.5 - 14.5 % Final  . Platelets 02/24/2016 265  150 - 440 K/uL Final  . Neutrophils Relative % 02/24/2016 60   Final  . Neutro Abs 02/24/2016 2.3  1.4 - 6.5 K/uL Final  . Lymphocytes Relative 02/24/2016 22   Final  . Lymphs Abs 02/24/2016 0.8* 1.0 - 3.6 K/uL Final  . Monocytes Relative 02/24/2016 12   Final  . Monocytes Absolute 02/24/2016 0.4  0.2 - 1.0 K/uL Final  . Eosinophils Relative 02/24/2016 5   Final  . Eosinophils  Absolute 02/24/2016 0.2  0 - 0.7 K/uL Final  . Basophils Relative 02/24/2016 1   Final  . Basophils Absolute 02/24/2016 0.0  0 - 0.1 K/uL Final  . Sodium 02/24/2016 136  135 - 145 mmol/L Final  . Potassium 02/24/2016 4.0  3.5 - 5.1 mmol/L Final  . Chloride 02/24/2016 99* 101 - 111 mmol/L Final  . CO2 02/24/2016 30  22 - 32 mmol/L Final  . Glucose, Bld 02/24/2016 136* 65 - 99 mg/dL Final  . BUN 02/24/2016 28* 6 - 20 mg/dL Final  . Creatinine, Ser 02/24/2016 1.23  0.61 - 1.24 mg/dL Final  . Calcium 02/24/2016 9.1  8.9 - 10.3 mg/dL Final  . Total Protein 02/24/2016 6.7  6.5 - 8.1 g/dL Final  . Albumin 02/24/2016 3.9  3.5 - 5.0 g/dL Final  . AST 02/24/2016 51* 15 - 41 U/L Final  . ALT 02/24/2016 45  17 - 63 U/L Final  . Alkaline Phosphatase 02/24/2016 49  38 - 126 U/L Final  . Total Bilirubin 02/24/2016 1.1  0.3 - 1.2 mg/dL Final  .  GFR calc non Af Amer 02/24/2016 59* >60 mL/min Final  . GFR calc Af Amer 02/24/2016 >60  >60 mL/min Final   Comment: (NOTE) The eGFR has been calculated using the CKD EPI equation. This calculation has not been validated in all clinical situations. eGFR's persistently <60 mL/min signify possible Chronic Kidney Disease.   . Anion gap 02/24/2016 7  5 - 15 Final    STUDIES: No results found.  ASSESSMENT:  Invasive poorly differentiated squamous cell carcinoma of the left base of the tongue, stage II, T2 N1 M0. Iron deficiency anemia.  PLAN:   1. Left base of tongue cancer. Clinically patient appears to have no recurrent disease. Patient's most recent PET scan was reported in October 2016. At that time his oncologist determined he required no additional imaging studies unless new symptoms or issues occurred. Patient is doing very well and denies any acute complaints. 2. IDA with mild leukopenia. Etiology unclear. Previously thought that it could be a delayed toxicity of from chemotherapy. Patient is currently on 1 tablet of ferrous sulfate daily and  tolerating well. Advised patient to continue with ferrous sulfate and we would continue to monitor.  Patient to return in approximately 5-6 months for continued follow-up.  Patient expressed understanding and was in agreement with this plan. He also understands that He can call clinic at any time with any questions, concerns, or complaints.   Dr. Grayland Ormond was available for consultation and review of plan of care for this patient.   Evlyn Kanner, NP   02/24/2016 10:37 AM

## 2016-06-17 NOTE — Addendum Note (Signed)
Encounter addended by: Noreene Filbert, MD on: 06/17/2016 12:20 PM<BR>    Actions taken: Sign clinical note

## 2016-06-17 NOTE — Progress Notes (Signed)
Radiation Oncology Follow up Note  Name: Manuel Buck   Date:   02/06/2016 MRN:  [id number] DOB: [birth year]    This 65 y.o. male presents to the clinic today for follow-up for stage II (T2 N1 M0) poorly differentiated [place] cell carcinoma the base of tongue status post concurrent chemoradiation.  REFERRING PROVIDER: Kirk Ruths, MD  HPI: Patient is a 65 year old male now out a year having completed combined modality treatment with chemoradiation for stage II poorly differentiated squamous cell carcinoma the base of tongue. He continues to have slight dry mouth and alteration of taste. Specifically denies head and neck pain or dysphagia.Marland Kitchen His last PET CT scan 1 year prior showed no evidence of disease.  COMPLICATIONS OF TREATMENT: none  FOLLOW UP COMPLIANCE: keeps appointments   PHYSICAL EXAM:  BP 134/75   Pulse (!) 53   Temp (!) 95.6 F (35.3 C)   Resp 20   Wt 185 lb 3 oz (84 kg)   BMI 24.43 kg/m  Oral cavity is clear no oral mucosal lesions are identified indirect mirror examination shows upper airway clear vallecula and base of tongue within normal limits. Neck is clear without evidence of subject gastric cervical or supraclavicular adenopathy. Patient does have mild xerostomia. Well-developed well-nourished patient in NAD. HEENT reveals PERLA, EOMI, discs not visualized.  Oral cavity is clear. No oral mucosal lesions are identified. Neck is clear without evidence of cervical or supraclavicular adenopathy. Lungs are clear to A&P. Cardiac examination is essentially unremarkable with regular rate and rhythm without murmur rub or thrill. Abdomen is benign with no organomegaly or masses noted. Motor sensory and DTR levels are equal and symmetric in the upper and lower extremities. Cranial nerves II through XII are grossly intact. Proprioception is intact. No peripheral adenopathy or edema is identified. No motor or sensory levels are noted. Crude visual fields are within normal  range.  RADIOLOGY RESULTS: PET CT from last year is reviewed compatible with the above-stated findings  PLAN: At the present time patient continues to do well with no evidence of disease. I am please was overall progress. I've asked to see him back in 1 year for follow-up. Patient is to call sooner with any concerns.  I would like to take this opportunity to thank you for allowing me to participate in the care of your patient.Armstead Peaks., MD

## 2016-08-19 ENCOUNTER — Ambulatory Visit
Admission: RE | Admit: 2016-08-19 | Discharge: 2016-08-19 | Disposition: A | Discharge status: Home / Self Care | Payer: Commercial Managed Care - PPO | Source: Ambulatory Visit | Attending: Radiation Oncology | Admitting: Radiation Oncology

## 2016-08-19 ENCOUNTER — Encounter: Payer: Self-pay | Admitting: Radiation Oncology

## 2016-08-19 VITALS — BP 146/81 | HR 73 | Temp 97.2°F | Resp 20 | Wt 195.5 lb

## 2016-08-19 DIAGNOSIS — Z85819 Personal history of malignant neoplasm of unspecified site of lip, oral cavity, and pharynx: Secondary | ICD-10-CM | POA: Diagnosis not present

## 2016-08-19 DIAGNOSIS — Z923 Personal history of irradiation: Secondary | ICD-10-CM | POA: Diagnosis not present

## 2016-08-19 DIAGNOSIS — C01 Malignant neoplasm of base of tongue: Secondary | ICD-10-CM

## 2016-08-19 DIAGNOSIS — R1310 Dysphagia, unspecified: Secondary | ICD-10-CM | POA: Diagnosis not present

## 2016-08-19 DIAGNOSIS — Z9221 Personal history of antineoplastic chemotherapy: Secondary | ICD-10-CM | POA: Insufficient documentation

## 2016-08-19 DIAGNOSIS — Z87891 Personal history of nicotine dependence: Secondary | ICD-10-CM | POA: Diagnosis not present

## 2016-08-19 NOTE — Progress Notes (Signed)
Radiation Oncology Follow up Note  Name: Manuel Buck   Date:   08/19/2016 MRN:  [id number] DOB: 06/17/1951    This 65 y.o. male presents to the clinic today for one half year follow-up for stage II squamous cell carcinoma of the base of tongue status post concurrent chemoradiation.  REFERRING PROVIDER: Kirk Ruths, MD  HPI: Patient is a 65 year old male now out a year and a half having completed concurrent chemoradiation for stage II (T2 N1 M0) poorly differentiated squamous cell carcinoma the base of tongue status post concurrent chemoradiation he is seen today in routine follow-up and is doing well. He still has some slight dysphasia makes sure his food is in small cutup portions. He's having no head and neck pain. He had a fiberoptic laryngoscopy today showing no evidence of disease by the ENT team. He is seen today in routine follow-up.  COMPLICATIONS OF TREATMENT: none  FOLLOW UP COMPLIANCE: keeps appointments   PHYSICAL EXAM:  BP (!) 146/81   Pulse 73   Temp 97.2 F (36.2 C)   Resp 20   Wt 195 lb 8.1 oz (88.7 kg)   BMI 26.52 kg/m  Oral cavity is clear no oral mucosal lesions are identified. Indirect mirror examination shows base of tongue within normal limits vallecula and upper airway clear. Neck is clear without evidence of subject gastric cervical or supraclavicular adenopathy. Well-developed well-nourished patient in NAD. HEENT reveals PERLA, EOMI, discs not visualized.  Oral cavity is clear. No oral mucosal lesions are identified. Neck is clear without evidence of cervical or supraclavicular adenopathy. Lungs are clear to A&P. Cardiac examination is essentially unremarkable with regular rate and rhythm without murmur rub or thrill. Abdomen is benign with no organomegaly or masses noted. Motor sensory and DTR levels are equal and symmetric in the upper and lower extremities. Cranial nerves II through XII are grossly intact. Proprioception is intact. No peripheral  adenopathy or edema is identified. No motor or sensory levels are noted. Crude visual fields are within normal range.  RADIOLOGY RESULTS: No current films for review  PLAN: Present time patient is doing well with no evidence of disease. I've asked to see him back in 1 year for follow-up. He continues close follow-up care with ENT. Patient is to call sooner with any concerns.  I would like to take this opportunity to thank you for allowing me to participate in the care of your patient.Armstead Peaks., MD

## 2017-05-30 DIAGNOSIS — Z79899 Other long term (current) drug therapy: Secondary | ICD-10-CM | POA: Diagnosis not present

## 2017-05-30 DIAGNOSIS — E119 Type 2 diabetes mellitus without complications: Secondary | ICD-10-CM | POA: Diagnosis not present

## 2017-06-06 DIAGNOSIS — I10 Essential (primary) hypertension: Secondary | ICD-10-CM | POA: Diagnosis not present

## 2017-06-06 DIAGNOSIS — E119 Type 2 diabetes mellitus without complications: Secondary | ICD-10-CM | POA: Diagnosis not present

## 2017-06-06 DIAGNOSIS — Z0000 Encounter for general adult medical examination without abnormal findings: Secondary | ICD-10-CM | POA: Diagnosis not present

## 2017-06-06 DIAGNOSIS — I739 Peripheral vascular disease, unspecified: Secondary | ICD-10-CM | POA: Diagnosis not present

## 2017-06-06 DIAGNOSIS — E782 Mixed hyperlipidemia: Secondary | ICD-10-CM | POA: Diagnosis not present

## 2017-08-24 ENCOUNTER — Other Ambulatory Visit: Payer: Self-pay

## 2017-08-24 ENCOUNTER — Encounter: Payer: Self-pay | Admitting: Radiation Oncology

## 2017-08-24 ENCOUNTER — Ambulatory Visit
Admission: RE | Admit: 2017-08-24 | Discharge: 2017-08-24 | Disposition: A | Discharge status: Home / Self Care | Payer: PPO | Source: Ambulatory Visit | Attending: Radiation Oncology | Admitting: Radiation Oncology

## 2017-08-24 VITALS — BP 135/79 | HR 60 | Temp 97.8°F | Resp 18 | Wt 193.5 lb

## 2017-08-24 DIAGNOSIS — Z923 Personal history of irradiation: Secondary | ICD-10-CM | POA: Diagnosis not present

## 2017-08-24 DIAGNOSIS — Z87891 Personal history of nicotine dependence: Secondary | ICD-10-CM | POA: Diagnosis not present

## 2017-08-24 DIAGNOSIS — Z85810 Personal history of malignant neoplasm of tongue: Secondary | ICD-10-CM | POA: Insufficient documentation

## 2017-08-24 DIAGNOSIS — Z9221 Personal history of antineoplastic chemotherapy: Secondary | ICD-10-CM | POA: Diagnosis not present

## 2017-08-24 DIAGNOSIS — C01 Malignant neoplasm of base of tongue: Secondary | ICD-10-CM

## 2017-08-24 NOTE — Progress Notes (Signed)
Radiation Oncology Follow up Note  Name: Manuel Buck   Date:   08/24/2017 MRN:  [id number] DOB: 1950-11-17    This 66 y.o. male presents to the clinic today for a 2 year follow-up status post concurrent chemoradiation for squamous cell carcinoma the base of tongue.  REFERRING PROVIDER: Kirk Ruths, MD  HPI: Patient is a 66 year old male now seen out 2 years having completed concurrent chemoradiation for stage II (T2 N1 M0) poorly differentiated squamous cell carcinoma of the base of tongue. He is seen today in routine follow-up and continues to do well. He specifically denies dysphagia or head and neck pain still has some dryness of the mouth.. He has not had any recent imaging.  COMPLICATIONS OF TREATMENT: none  FOLLOW UP COMPLIANCE: keeps appointments   PHYSICAL EXAM:  BP 135/79   Pulse 60   Temp 97.8 F (36.6 C)   Resp 18   Wt 193 lb 7.3 oz (87.8 kg)   BMI 26.24 kg/m  Oral cavity is clear no oral mucosal lesions are identified indirect mirror examination shows upper airway clear vallecula and base of tongue within normal limits. Neck is clear without evidence of subject gastric cervical or supraclavicular adenopathy. Well-developed well-nourished patient in NAD. HEENT reveals PERLA, EOMI, discs not visualized.  Oral cavity is clear. No oral mucosal lesions are identified. Neck is clear without evidence of cervical or supraclavicular adenopathy. Lungs are clear to A&P. Cardiac examination is essentially unremarkable with regular rate and rhythm without murmur rub or thrill. Abdomen is benign with no organomegaly or masses noted. Motor sensory and DTR levels are equal and symmetric in the upper and lower extremities. Cranial nerves II through XII are grossly intact. Proprioception is intact. No peripheral adenopathy or edema is identified. No motor or sensory levels are noted. Crude visual fields are within normal range.  RADIOLOGY RESULTS: No current films for  review  PLAN: At the present time patient continues to do well with no evidence of disease. I'm please was overall progress. I have asked to see him back in 1 year for follow-up. He continues close follow-up care with ENT. I've asked his PMD to perform at least semiannual chest x-ray on the patient. Patient knows to call with any concerns.  I would like to take this opportunity to thank you for allowing me to participate in the care of your patient.Noreene Filbert, MD

## 2017-12-09 DIAGNOSIS — J069 Acute upper respiratory infection, unspecified: Secondary | ICD-10-CM | POA: Diagnosis not present

## 2017-12-09 DIAGNOSIS — J40 Bronchitis, not specified as acute or chronic: Secondary | ICD-10-CM | POA: Diagnosis not present

## 2018-05-31 DIAGNOSIS — I10 Essential (primary) hypertension: Secondary | ICD-10-CM | POA: Diagnosis not present

## 2018-05-31 DIAGNOSIS — Z79899 Other long term (current) drug therapy: Secondary | ICD-10-CM | POA: Diagnosis not present

## 2018-05-31 DIAGNOSIS — Z125 Encounter for screening for malignant neoplasm of prostate: Secondary | ICD-10-CM | POA: Diagnosis not present

## 2018-05-31 DIAGNOSIS — E119 Type 2 diabetes mellitus without complications: Secondary | ICD-10-CM | POA: Diagnosis not present

## 2018-05-31 DIAGNOSIS — E782 Mixed hyperlipidemia: Secondary | ICD-10-CM | POA: Diagnosis not present

## 2018-05-31 DIAGNOSIS — I739 Peripheral vascular disease, unspecified: Secondary | ICD-10-CM | POA: Diagnosis not present

## 2018-06-07 DIAGNOSIS — I10 Essential (primary) hypertension: Secondary | ICD-10-CM | POA: Diagnosis not present

## 2018-06-07 DIAGNOSIS — I739 Peripheral vascular disease, unspecified: Secondary | ICD-10-CM | POA: Diagnosis not present

## 2018-06-07 DIAGNOSIS — E782 Mixed hyperlipidemia: Secondary | ICD-10-CM | POA: Diagnosis not present

## 2018-06-07 DIAGNOSIS — Z0000 Encounter for general adult medical examination without abnormal findings: Secondary | ICD-10-CM | POA: Diagnosis not present

## 2018-06-07 DIAGNOSIS — E119 Type 2 diabetes mellitus without complications: Secondary | ICD-10-CM | POA: Diagnosis not present

## 2018-06-21 DIAGNOSIS — I739 Peripheral vascular disease, unspecified: Secondary | ICD-10-CM | POA: Diagnosis not present

## 2018-06-30 DIAGNOSIS — Z86010 Personal history of colonic polyps: Secondary | ICD-10-CM | POA: Diagnosis not present

## 2018-06-30 DIAGNOSIS — E119 Type 2 diabetes mellitus without complications: Secondary | ICD-10-CM | POA: Diagnosis not present

## 2018-07-07 ENCOUNTER — Encounter (INDEPENDENT_AMBULATORY_CARE_PROVIDER_SITE_OTHER): Payer: Self-pay | Admitting: Nurse Practitioner

## 2018-07-10 ENCOUNTER — Ambulatory Visit (INDEPENDENT_AMBULATORY_CARE_PROVIDER_SITE_OTHER): Payer: PPO | Admitting: Nurse Practitioner

## 2018-07-10 ENCOUNTER — Encounter (INDEPENDENT_AMBULATORY_CARE_PROVIDER_SITE_OTHER): Payer: Self-pay | Admitting: Nurse Practitioner

## 2018-07-10 VITALS — BP 129/71 | HR 53 | Resp 17 | Ht 72.0 in | Wt 188.0 lb

## 2018-07-10 DIAGNOSIS — I10 Essential (primary) hypertension: Secondary | ICD-10-CM | POA: Diagnosis not present

## 2018-07-10 DIAGNOSIS — I739 Peripheral vascular disease, unspecified: Secondary | ICD-10-CM

## 2018-07-10 DIAGNOSIS — C01 Malignant neoplasm of base of tongue: Secondary | ICD-10-CM

## 2018-07-10 DIAGNOSIS — Z87891 Personal history of nicotine dependence: Secondary | ICD-10-CM | POA: Diagnosis not present

## 2018-07-10 NOTE — Progress Notes (Signed)
Subjective:    Patient ID: Manuel Buck, male    DOB: 1951/06/03, 67 y.o.   MRN: [id number] Chief Complaint  Patient presents with  . New Patient (Initial Visit)    Abnormal ABI     HPI  Manuel Buck is a 67 y.o. male that presents today from Dr. Tonette Bihari office.  Manuel Buck underwent cancer treatment and has not had treatment since April 2016.  During his treatment he lost approximately 100 pounds.  Following his treatment he is begun to have shooting pain from the hip down.  He states that most times it happens when he is in his bed or sitting in his recliner.  He also has tingling from the knee length down, which also happens in his bed or in his recliner.  Prior to treatment he was able to walk approximately 5 miles per day however now he states that he has issues walking more than 500 feet.  He states that now he has a lot of weakness of his lower extremities with walking.  He noticed all this pain and weakness following after treatment.  He has not tried Tylenol or NSAIDs for pain relief.  He denies any history of back pain.  He also endorses having extensive swelling in his bilateral lower extremities, however after changing blood pressure medications his swelling is greatly decreased.  The patient was previously on Norvasc.  He underwent bilateral ABIs at Dr. Tonette Bihari office.  His right ABI was 0.82 and his left ABI was 1.20.  He appears to have biphasic waveforms on the right and triphasic on the left.  Past Medical History:  Diagnosis Date  . Cancer ([place])    tongue cancer 2016  . Diabetes mellitus without complication ([place])    Diet Controlled  . GERD (gastroesophageal reflux disease)   . Hypertension   . Skipped heart beats    occassionally-pt asymptomatic 01-21-2015)    Past Surgical History:  Procedure Laterality Date  . ANKLE FRACTURE SURGERY    . DIRECT LARYNGOSCOPY N/A 01/22/2015   Procedure: DIRECT LARYNGOSCOPY/LARYNGOSCOPY WITH BIOPSY;  Surgeon: Clyde Canterbury, MD;  Location: [facility] ORS;  Service: ENT;  Laterality: N/A;  . NASAL ENDOSCOPY    . SHOULDER ARTHROSCOPY Right   . SKIN GRAFT    . TONSILLECTOMY      Social History   Socioeconomic History  . Marital status: Single    Spouse name: Not on file  . Number of children: Not on file  . Years of education: Not on file  . Highest education level: Not on file  Occupational History  . Not on file  Social Needs  . Financial resource strain: Not on file  . Food insecurity:    Worry: Not on file    Inability: Not on file  . Transportation needs:    Medical: Not on file    Non-medical: Not on file  Tobacco Use  . Smoking status: Former Smoker    Last attempt to quit: 01/20/1994    Years since quitting: 24.4  . Smokeless tobacco: Never Used  Substance and Sexual Activity  . Alcohol use: No  . Drug use: No  . Sexual activity: Not on file  Lifestyle  . Physical activity:    Days per week: Not on file    Minutes per session: Not on file  . Stress: Not on file  Relationships  . Social connections:    Talks on phone: Not on file    Gets together:  Not on file    Attends religious service: Not on file    Active member of club or organization: Not on file    Attends meetings of clubs or organizations: Not on file    Relationship status: Not on file  . Intimate partner violence:    Fear of current or ex partner: Not on file    Emotionally abused: Not on file    Physically abused: Not on file    Forced sexual activity: Not on file  Other Topics Concern  . Not on file  Social History Narrative  . Not on file    History reviewed. No pertinent family history.  Allergies  Allergen Reactions  . Statins Other (See Comments) and Rash  . Sulfa Antibiotics Rash     Review of Systems   Review of Systems: Negative Unless Checked Constitutional: [] Weight loss  [] Fever  [] Chills Cardiac: [] Chest pain   []  Atrial Fibrillation  [] Palpitations   [] Shortness of breath when laying  flat   [] Shortness of breath with exertion. Vascular:  [x] Pain in legs with walking   [x] Pain in legs with standing  [] History of DVT   [] Phlebitis   [x] Swelling in legs   [x] Varicose veins   [] Non-healing ulcers Pulmonary:   [] Uses home oxygen   [] Productive cough   [] Hemoptysis   [] Wheeze  [] COPD   [] Asthma Neurologic:  [] Dizziness   [] Seizures   [] History of stroke   [] History of TIA  [] Aphasia   [] Vissual changes   [] Weakness or numbness in arm   [x] Weakness or numbness in leg Musculoskeletal:   [] Joint swelling   [] Joint pain   [] Low back pain  []  History of Knee Replacement Hematologic:  [] Easy bruising  [] Easy bleeding   [] Hypercoagulable state   [] Anemic Gastrointestinal:  [] Diarrhea   [] Vomiting  [] Gastroesophageal reflux/heartburn   [] Difficulty swallowing. Genitourinary:  [] Chronic kidney disease   [] Difficult urination  [] Anuric   [] Blood in urine Skin:  [] Rashes   [] Ulcers  Psychological:  [] History of anxiety   []  History of major depression  []  Memory Difficulties     Objective:   Physical Exam  BP 129/71 (BP Location: Right Arm, Patient Position: Sitting)   Pulse (!) 53   Resp 17   Ht 6' (1.829 m)   Wt 188 lb (85.3 kg)   BMI 25.50 kg/m   Gen: WD/WN, NAD Head: [place]/AT, No temporalis wasting.  Ear/Nose/Throat: Hearing grossly intact, nares w/o erythema or drainage Eyes: PER, EOMI, sclera nonicteric.  Neck: Supple, no masses.  No JVD.  Pulmonary:  Good air movement, no use of accessory muscles.  Cardiac: RRR Vascular:  No swelling present today.  Scattered small varicosities bilaterally. Vessel Right Left  Radial Palpable Palpable  Dorsalis Pedis  trace palpable  trace palpable  Posterior Tibial Palpable Palpable   Gastrointestinal: soft, non-distended. No guarding/no peritoneal signs.  Musculoskeletal: M/S 5/5 throughout.  No deformity or atrophy.  Neurologic: Pain and light touch intact in extremities.  Symmetrical.  Speech is fluent. Motor exam as listed  above. Psychiatric: Judgment intact, Mood & affect appropriate for pt's clinical situation. Dermatologic: No Venous rashes. No Ulcers Noted.  No changes consistent with cellulitis. Lymph : No Cervical lymphadenopathy, no lichenification or skin changes of chronic lymphedema.      Assessment & Plan:   1. Peripheral vascular disease ([place])  Recommend:  The patient has atypical pain symptoms for pure atherosclerotic disease.  The patient's ABIs are typically not consistent with what we would see with  claudication-like symptoms, however the pressures on the right lower extremity are somewhat lower at the anterior tibial artery.  We will obtain a bilateral lower extremity arterial duplex to determine if there are areas of stenosis that could possibly be causing his symptoms.  However, if these tests are negative, it would be prudent to explore lower back causes as well as possible neuropathy.  Patient will follow-up in office following his test.  Patient and wife understood.  - VAS [place] LOWER EXTREMITY ARTERIAL DUPLEX; Future  2. Essential (primary) hypertension Continue antihypertensive medications as already ordered, these medications have been reviewed and there are no changes at this time.   3. Malignant neoplasm of base of tongue ([place]) Patient is currently in remission.  His last treatment was April 2016.  Will defer to primary care provider and oncologist.   Current Outpatient Medications on File Prior to Visit  Medication Sig Dispense Refill  . acetaminophen (TYLENOL) 500 MG tablet Take 500 mg by mouth every 6 (six) hours as needed for mild pain or moderate pain.    Marland Kitchen aspirin EC 81 MG tablet Take by mouth.    . cyanocobalamin 500 MCG tablet Take 500 mcg by mouth daily.    . ferrous sulfate 325 (65 FE) MG tablet Take 325 mg by mouth daily with breakfast.     . MULTIPLE VITAMIN PO Take 1 tablet by mouth.    . olmesartan-hydrochlorothiazide (BENICAR HCT) 20-12.5 MG tablet Take 1 tablet  by mouth daily.  11  . amLODipine (NORVASC) 5 MG tablet Take by mouth.     No current facility-administered medications on file prior to visit.     There are no Patient Instructions on file for this visit. No follow-ups on file.   Kris Hartmann, NP  This note was completed with Sales executive.  Any errors are purely unintentional.

## 2018-07-11 DIAGNOSIS — E782 Mixed hyperlipidemia: Secondary | ICD-10-CM | POA: Diagnosis not present

## 2018-07-11 DIAGNOSIS — Z789 Other specified health status: Secondary | ICD-10-CM | POA: Diagnosis not present

## 2018-07-11 DIAGNOSIS — E119 Type 2 diabetes mellitus without complications: Secondary | ICD-10-CM | POA: Diagnosis not present

## 2018-07-11 DIAGNOSIS — Z125 Encounter for screening for malignant neoplasm of prostate: Secondary | ICD-10-CM | POA: Diagnosis not present

## 2018-07-11 DIAGNOSIS — I739 Peripheral vascular disease, unspecified: Secondary | ICD-10-CM | POA: Diagnosis not present

## 2018-07-11 DIAGNOSIS — I10 Essential (primary) hypertension: Secondary | ICD-10-CM | POA: Diagnosis not present

## 2018-07-18 ENCOUNTER — Other Ambulatory Visit (INDEPENDENT_AMBULATORY_CARE_PROVIDER_SITE_OTHER): Payer: Self-pay | Admitting: Nurse Practitioner

## 2018-07-18 DIAGNOSIS — I739 Peripheral vascular disease, unspecified: Secondary | ICD-10-CM

## 2018-07-19 ENCOUNTER — Ambulatory Visit (INDEPENDENT_AMBULATORY_CARE_PROVIDER_SITE_OTHER): Payer: PPO

## 2018-07-19 ENCOUNTER — Ambulatory Visit (INDEPENDENT_AMBULATORY_CARE_PROVIDER_SITE_OTHER): Payer: PPO | Admitting: Nurse Practitioner

## 2018-07-19 ENCOUNTER — Encounter (INDEPENDENT_AMBULATORY_CARE_PROVIDER_SITE_OTHER): Payer: Self-pay | Admitting: Nurse Practitioner

## 2018-07-19 VITALS — BP 111/64 | HR 62 | Resp 16 | Ht 72.0 in | Wt 188.0 lb

## 2018-07-19 DIAGNOSIS — I739 Peripheral vascular disease, unspecified: Secondary | ICD-10-CM

## 2018-07-19 DIAGNOSIS — I10 Essential (primary) hypertension: Secondary | ICD-10-CM

## 2018-07-19 DIAGNOSIS — E119 Type 2 diabetes mellitus without complications: Secondary | ICD-10-CM | POA: Diagnosis not present

## 2018-07-19 DIAGNOSIS — Z87891 Personal history of nicotine dependence: Secondary | ICD-10-CM | POA: Diagnosis not present

## 2018-07-19 NOTE — Progress Notes (Signed)
Subjective:    Patient ID: [name], male    DOB: 12-24-1950, 67 y.o.   MRN: [id number] Chief Complaint  Patient presents with  . Follow-up    ultrasound follow up    HPI  Manuel Buck is a 67 y.o. male that is following up from a referral made by his primary care provider Dr. Ouida Buck in regards to pain in his lower extremities.  He states that at times he has shooting pain from his hip down.  He also states that after physical activity he has pain and tingling of his calf area.  He states that he has no issues while doing physical activity in most instances.  There is a slight discrepancy based on the distance that he is able to walk.  He states that he can walk 500 feet with no issues, however contrasted by saying that he can walk a mile with no problems.  Today he had bilateral ABIs done, the right lower extremity was 1.02 and the left was 0.96.  There are monophasic waveforms in the bilateral anterior tibial arteries and biphasic waveforms in the bilateral posterior tibial arteries.  This is consistent with the bilateral lower extremity arterial duplex that was done today.  Although on the left lower extremity there appears to be a possible occlusion of the distal anterior tibial artery.  There are triphasic flows down to the level of the distal popliteal artery.  Past Medical History:  Diagnosis Date  . Cancer ([place])    tongue cancer 2016  . Diabetes mellitus without complication ([place])    Diet Controlled  . GERD (gastroesophageal reflux disease)   . Hypertension   . Skipped heart beats    occassionally-pt asymptomatic 01-21-2015)    Past Surgical History:  Procedure Laterality Date  . ANKLE FRACTURE SURGERY    . DIRECT LARYNGOSCOPY N/A 01/22/2015   Procedure: DIRECT LARYNGOSCOPY/LARYNGOSCOPY WITH BIOPSY;  Surgeon: Clyde Canterbury, MD;  Location: [facility] ORS;  Service: ENT;  Laterality: N/A;  . NASAL ENDOSCOPY    . SHOULDER ARTHROSCOPY Right   . SKIN GRAFT    .  TONSILLECTOMY      Social History   Socioeconomic History  . Marital status: Single    Spouse name: Not on file  . Number of children: Not on file  . Years of education: Not on file  . Highest education level: Not on file  Occupational History  . Not on file  Social Needs  . Financial resource strain: Not on file  . Food insecurity:    Worry: Not on file    Inability: Not on file  . Transportation needs:    Medical: Not on file    Non-medical: Not on file  Tobacco Use  . Smoking status: Former Smoker    Last attempt to quit: 01/20/1994    Years since quitting: 24.5  . Smokeless tobacco: Never Used  Substance and Sexual Activity  . Alcohol use: No  . Drug use: No  . Sexual activity: Not on file  Lifestyle  . Physical activity:    Days per week: Not on file    Minutes per session: Not on file  . Stress: Not on file  Relationships  . Social connections:    Talks on phone: Not on file    Gets together: Not on file    Attends religious service: Not on file    Active member of club or organization: Not on file    Attends meetings of  clubs or organizations: Not on file    Relationship status: Not on file  . Intimate partner violence:    Fear of current or ex partner: Not on file    Emotionally abused: Not on file    Physically abused: Not on file    Forced sexual activity: Not on file  Other Topics Concern  . Not on file  Social History Narrative  . Not on file    History reviewed. No pertinent family history.  Allergies  Allergen Reactions  . Statins Other (See Comments) and Rash  . Sulfa Antibiotics Rash     Review of Systems   Review of Systems: Negative Unless Checked Constitutional: [] Weight loss  [] Fever  [] Chills Cardiac: [] Chest pain   []  Atrial Fibrillation  [] Palpitations   [] Shortness of breath when laying flat   [] Shortness of breath with exertion. Vascular:  [x] Pain in legs with walking   [x] Pain in legs with standing  [] History of DVT    [] Phlebitis   [x] Swelling in legs   [x] Varicose veins   [] Non-healing ulcers Pulmonary:   [] Uses home oxygen   [] Productive cough   [] Hemoptysis   [] Wheeze  [] COPD   [] Asthma Neurologic:  [] Dizziness   [] Seizures   [] History of stroke   [] History of TIA  [] Aphasia   [] Vissual changes   [] Weakness or numbness in arm   [x] Weakness or numbness in leg Musculoskeletal:   [] Joint swelling   [] Joint pain   [] Low back pain  []  History of Knee Replacement Hematologic:  [] Easy bruising  [] Easy bleeding   [] Hypercoagulable state   [] Anemic Gastrointestinal:  [] Diarrhea   [] Vomiting  [] Gastroesophageal reflux/heartburn   [] Difficulty swallowing. Genitourinary:  [] Chronic kidney disease   [] Difficult urination  [] Anuric   [] Blood in urine Skin:  [] Rashes   [] Ulcers  Psychological:  [] History of anxiety   []  History of major depression  []  Memory Difficulties     Objective:   Physical Exam  BP 111/64 (BP Location: Right Arm)   Pulse 62   Resp 16   Ht 6' (1.829 m)   Wt 188 lb (85.3 kg)   BMI 25.50 kg/m   Gen: WD/WN, NAD Head: [place]/AT, No temporalis wasting.  Ear/Nose/Throat: Hearing grossly intact, nares w/o erythema or drainage Eyes: PER, EOMI, sclera nonicteric.  Neck: Supple, no masses.  No JVD.  Pulmonary:  Good air movement, no use of accessory muscles.  Cardiac: RRR Vascular:  Vessel Right Left  Radial Palpable Palpable  Dorsalis Pedis Trace Palpable Trace Palpable  Posterior Tibial Palpable Palpable   Gastrointestinal: soft, non-distended. No guarding/no peritoneal signs.  Musculoskeletal: M/S 5/5 throughout.  No deformity or atrophy.  Neurologic: Pain and light touch intact in extremities.  Symmetrical.  Speech is fluent. Motor exam as listed above. Psychiatric: Judgment intact, Mood & affect appropriate for pt's clinical situation. Dermatologic: No Venous rashes. No Ulcers Noted.  No changes consistent with cellulitis. Lymph : No Cervical lymphadenopathy, no lichenification or skin  changes of chronic lymphedema.      Assessment & Plan:   1. Peripheral vascular disease ([place]) Today he had bilateral ABIs done, the right lower extremity was 1.02 and the left was 0.96.  There are monophasic waveforms in the bilateral anterior tibial arteries and biphasic waveforms in the bilateral posterior tibial arteries.  This is consistent with the bilateral lower extremity arterial duplex that was done today.  Although on the left lower extremity there appears to be a possible occlusion of the distal anterior tibial artery.  All arteries above the distal popliteal had triphasic flow.   Recommend:  The patient has evidence of atherosclerosis of the lower extremities with claudication.  The patient does not voice lifestyle limiting changes at this point in time.  Noninvasive studies do not suggest clinically significant change.  The patient wishes to maintain conservative measures by close monitoring at this time.  No invasive studies, angiography or surgery at this time The patient should continue walking and begin a more formal exercise program.  The patient should continue antiplatelet therapy and aggressive treatment of the lipid abnormalities  No changes in the patient's medications at this time  The patient should continue wearing graduated compression socks 10-15 mmHg strength to control the mild edema.  The patient will follow-up in 3 months for bilateral ABIs.  I also spoke with the patient in regards to the shooting pain from his back as well as the occasional weakness he feels in his thighs.  I suggested that he speak with Dr. Ouida Buck once again to examine possible back related causes for this.  - VAS [place] ABI WITH/WO TBI; Future  2. Essential (primary) hypertension Continue antihypertensive medications as already ordered, these medications have been reviewed and there are no changes at this time.   3. Type 2 diabetes mellitus without complication, without long-term  current use of insulin (HCC) Continue hypoglycemic medications as already ordered, these medications have been reviewed and there are no changes at this time.  Hgb A1C to be monitored as already arranged by primary service    Current Outpatient Medications on File Prior to Visit  Medication Sig Dispense Refill  . acetaminophen (TYLENOL) 500 MG tablet Take 500 mg by mouth every 6 (six) hours as needed for mild pain or moderate pain.    Marland Kitchen aspirin EC 81 MG tablet Take by mouth.    . cyanocobalamin 500 MCG tablet Take 500 mcg by mouth daily.    . ferrous sulfate 325 (65 FE) MG tablet Take 325 mg by mouth daily with breakfast.     . MULTIPLE VITAMIN PO Take 1 tablet by mouth.    . olmesartan-hydrochlorothiazide (BENICAR HCT) 20-12.5 MG tablet Take 1 tablet by mouth daily.  11  . amLODipine (NORVASC) 5 MG tablet Take by mouth.     No current facility-administered medications on file prior to visit.     There are no Patient Instructions on file for this visit. No follow-ups on file.   Kris Hartmann, NP  This note was completed with Sales executive.  Any errors are purely unintentional.

## 2018-08-28 ENCOUNTER — Encounter: Payer: Self-pay | Admitting: Radiation Oncology

## 2018-08-28 ENCOUNTER — Other Ambulatory Visit: Payer: Self-pay

## 2018-08-28 ENCOUNTER — Ambulatory Visit
Admission: RE | Admit: 2018-08-28 | Discharge: 2018-08-28 | Disposition: A | Discharge status: Home / Self Care | Payer: PPO | Source: Ambulatory Visit | Attending: Radiation Oncology | Admitting: Radiation Oncology

## 2018-08-28 VITALS — BP 144/84 | HR 67 | Temp 97.3°F | Resp 18 | Wt 189.7 lb

## 2018-08-28 DIAGNOSIS — Z87891 Personal history of nicotine dependence: Secondary | ICD-10-CM | POA: Insufficient documentation

## 2018-08-28 DIAGNOSIS — Z85810 Personal history of malignant neoplasm of tongue: Secondary | ICD-10-CM | POA: Diagnosis not present

## 2018-08-28 DIAGNOSIS — Z923 Personal history of irradiation: Secondary | ICD-10-CM | POA: Insufficient documentation

## 2018-08-28 DIAGNOSIS — C01 Malignant neoplasm of base of tongue: Secondary | ICD-10-CM

## 2018-08-28 NOTE — Progress Notes (Signed)
Radiation Oncology Follow up Note  Name: Manuel Buck   Date:   08/28/2018 MRN:  [id number] DOB: 02/05/1951    This 67 y.o. 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 He is seen today in routine follow-up is doing well specifically denies head and neck pain or dysphagia..he has not had any recent imaging.  COMPLICATIONS OF TREATMENT: none  FOLLOW UP COMPLIANCE: keeps appointments   PHYSICAL EXAM:  BP (!) 144/84 (BP Location: Left Arm, Patient Position: Sitting)   Pulse 67   Temp (!) 97.3 F (36.3 C) (Tympanic)   Resp 18   Wt 189 lb 11.3 oz (86 kg)   BMI 25.73 kg/m  Oral cavity is clear no oral mucosal lesions are identified indirect mirror examination shows upper airway clear vallecula and base of tongue within normal limits. Neck is clear without evidence of subdigastric cervical or supraclavicular adenopathy.Well-developed well-nourished patient in NAD. HEENT reveals PERLA, EOMI, discs not visualized.  Oral cavity is clear. No oral mucosal lesions are identified. Neck is clear without evidence of cervical or supraclavicular adenopathy. Lungs are clear to A&P. Cardiac examination is essentially unremarkable with regular rate and rhythm without murmur rub or thrill. Abdomen is benign with no organomegaly or masses noted. Motor sensory and DTR levels are equal and symmetric in the upper and lower extremities. Cranial nerves II through XII are grossly intact. Proprioception is intact. No peripheral adenopathy or edema is identified. No motor or sensory levels are noted. Crude visual fields are within normal range.  RADIOLOGY RESULTS: no current films for  review  PLAN: present time patient is doing well now out 3 years having completed concurrent chemotherapy radiation therapy for squamous cell carcinoma the base of tongue. He continues close follow-up care with ENT and upper endoscopy in their office. I've asked to see him back in 1 year for follow-up and will discontinue follow-up care that time. See no reason for any further imaging at this time  I would like to take this opportunity to thank you for allowing me to participate in the care of your patient.Noreene Filbert, MD

## 2018-09-11 ENCOUNTER — Ambulatory Visit: Payer: PPO | Admitting: Radiation Oncology

## 2018-09-20 ENCOUNTER — Encounter
Admission: RE | Disposition: A | Discharge status: Home / Self Care | Payer: Self-pay | Source: Home / Self Care | Attending: Unknown Physician Specialty

## 2018-09-20 ENCOUNTER — Ambulatory Visit: Payer: PPO | Admitting: Anesthesiology

## 2018-09-20 ENCOUNTER — Ambulatory Visit
Admission: RE | Admit: 2018-09-20 | Discharge: 2018-09-20 | Disposition: A | Discharge status: Home / Self Care | Payer: PPO | Attending: Unknown Physician Specialty | Admitting: Unknown Physician Specialty

## 2018-09-20 ENCOUNTER — Encounter: Payer: Self-pay | Admitting: *Deleted

## 2018-09-20 DIAGNOSIS — K219 Gastro-esophageal reflux disease without esophagitis: Secondary | ICD-10-CM | POA: Insufficient documentation

## 2018-09-20 DIAGNOSIS — Z87891 Personal history of nicotine dependence: Secondary | ICD-10-CM | POA: Diagnosis not present

## 2018-09-20 DIAGNOSIS — I10 Essential (primary) hypertension: Secondary | ICD-10-CM | POA: Insufficient documentation

## 2018-09-20 DIAGNOSIS — Z86010 Personal history of colonic polyps: Secondary | ICD-10-CM | POA: Diagnosis not present

## 2018-09-20 DIAGNOSIS — K5730 Diverticulosis of large intestine without perforation or abscess without bleeding: Secondary | ICD-10-CM | POA: Diagnosis not present

## 2018-09-20 DIAGNOSIS — Z1211 Encounter for screening for malignant neoplasm of colon: Secondary | ICD-10-CM | POA: Insufficient documentation

## 2018-09-20 DIAGNOSIS — K640 First degree hemorrhoids: Secondary | ICD-10-CM | POA: Diagnosis not present

## 2018-09-20 DIAGNOSIS — D649 Anemia, unspecified: Secondary | ICD-10-CM | POA: Insufficient documentation

## 2018-09-20 DIAGNOSIS — K5790 Diverticulosis of intestine, part unspecified, without perforation or abscess without bleeding: Secondary | ICD-10-CM | POA: Diagnosis not present

## 2018-09-20 DIAGNOSIS — K5140 Inflammatory polyps of colon without complications: Secondary | ICD-10-CM | POA: Diagnosis not present

## 2018-09-20 DIAGNOSIS — K648 Other hemorrhoids: Secondary | ICD-10-CM | POA: Diagnosis not present

## 2018-09-20 DIAGNOSIS — D122 Benign neoplasm of ascending colon: Secondary | ICD-10-CM | POA: Insufficient documentation

## 2018-09-20 DIAGNOSIS — Z85810 Personal history of malignant neoplasm of tongue: Secondary | ICD-10-CM | POA: Diagnosis not present

## 2018-09-20 DIAGNOSIS — I499 Cardiac arrhythmia, unspecified: Secondary | ICD-10-CM | POA: Insufficient documentation

## 2018-09-20 DIAGNOSIS — D126 Benign neoplasm of colon, unspecified: Secondary | ICD-10-CM | POA: Diagnosis not present

## 2018-09-20 DIAGNOSIS — E119 Type 2 diabetes mellitus without complications: Secondary | ICD-10-CM | POA: Insufficient documentation

## 2018-09-20 DIAGNOSIS — K635 Polyp of colon: Secondary | ICD-10-CM | POA: Diagnosis not present

## 2018-09-20 HISTORY — PX: COLONOSCOPY WITH PROPOFOL: SHX5780

## 2018-09-20 LAB — GLUCOSE, CAPILLARY: Glucose-Capillary: 68 mg/dL — ABNORMAL LOW (ref 70–99)

## 2018-09-20 SURGERY — COLONOSCOPY WITH PROPOFOL
Anesthesia: General

## 2018-09-20 MED ORDER — PHENYLEPHRINE HCL 10 MG/ML IJ SOLN
INTRAMUSCULAR | Status: DC | PRN
  Administered 2018-09-20: 100 ug via INTRAVENOUS

## 2018-09-20 MED ORDER — SODIUM CHLORIDE 0.9 % IV SOLN: INTRAVENOUS | Status: DC

## 2018-09-20 MED ORDER — SODIUM CHLORIDE 0.9 % IV SOLN
INTRAVENOUS | Status: DC
  Administered 2018-09-20: 09:00:00 via INTRAVENOUS

## 2018-09-20 MED ORDER — PROPOFOL 10 MG/ML IV BOLUS
INTRAVENOUS | Status: DC | PRN
  Administered 2018-09-20: 50 mg via INTRAVENOUS

## 2018-09-20 MED ORDER — LIDOCAINE HCL (CARDIAC) PF 100 MG/5ML IV SOSY
PREFILLED_SYRINGE | INTRAVENOUS | Status: DC | PRN
  Administered 2018-09-20: 80 mg via INTRAVENOUS

## 2018-09-20 MED ORDER — PROPOFOL 500 MG/50ML IV EMUL
INTRAVENOUS | Status: DC | PRN
  Administered 2018-09-20: 120 ug/kg/min via INTRAVENOUS

## 2018-09-20 MED ORDER — EPHEDRINE SULFATE 50 MG/ML IJ SOLN
INTRAMUSCULAR | Status: DC | PRN
  Administered 2018-09-20 (×2): 10 mg via INTRAVENOUS

## 2018-09-20 MED ORDER — PROPOFOL 10 MG/ML IV BOLUS
INTRAVENOUS | Status: AC
  Filled 2018-09-20: qty 40

## 2018-09-20 NOTE — Anesthesia Postprocedure Evaluation (Signed)
Anesthesia Post Note  Patient: Manuel Buck  Procedure(s) Performed: COLONOSCOPY WITH PROPOFOL (N/A )  Patient location during evaluation: Endoscopy Anesthesia Type: General Level of consciousness: awake and alert and oriented Pain management: pain level controlled Vital Signs Assessment: post-procedure vital signs reviewed and stable Respiratory status: spontaneous breathing, nonlabored ventilation and respiratory function stable Cardiovascular status: blood pressure returned to baseline and stable Postop Assessment: no signs of nausea or vomiting Anesthetic complications: no     Last Vitals:  Vitals:   09/20/18 1053 09/20/18 1059  BP:  (!) 142/75  Pulse: 76 67  Resp: 14 (!) 8  Temp:    SpO2: 98% 98%    Last Pain:  Vitals:   09/20/18 1040  TempSrc: Tympanic                 [name]

## 2018-09-20 NOTE — Op Note (Addendum)
[facility] Gastroenterology Patient Name: Manuel Buck Procedure Date: 09/20/2018 9:52 AM MRN: [id number] Account #: [id number] Date of Birth: 13-Dec-1950 Admit Type: Outpatient Age: 68 Room: [facility] ENDO ROOM 2 Gender: Male Note Status: Finalized Procedure:            Colonoscopy Indications:          High risk colon cancer surveillance: Personal history                        of colonic polyps Providers:            Manya Silvas, MD Referring MD:         Ocie Cornfield. Ouida Sills MD, MD (Referring MD) Medicines:            Propofol per Anesthesia Complications:        No immediate complications. Procedure:            Pre-Anesthesia Assessment:                       - After reviewing the risks and benefits, the patient                        was deemed in satisfactory condition to undergo the                        procedure.                       After obtaining informed consent, the colonoscope was                        passed under direct vision. Throughout the procedure,                        the patient's blood pressure, pulse, and oxygen                        saturations were monitored continuously. The                        Colonoscope was introduced through the anus and                        advanced to the the cecum, identified by appendiceal                        orifice and ileocecal valve. The colonoscopy was                        performed without difficulty. The patient tolerated the                        procedure well. The quality of the bowel preparation                        was good. Findings:      Ileocecal valve looked somewhat plump and I biopsied it.      A small polyp was found in the ascending colon. The polyp was sessile.       The polyp was removed with a hot snare. Resection and retrieval were  complete.      Many medium-mouthed diverticula were found in the sigmoid colon.      Internal hemorrhoids were found during  endoscopy. The hemorrhoids were       small and Grade I (internal hemorrhoids that do not prolapse). Impression:           - One small polyp in the ascending colon, removed with                        a hot snare. Resected and retrieved.                       - Diverticulosis in the sigmoid colon.                       - Internal hemorrhoids. Recommendation:       - Await pathology results. Manya Silvas, MD 09/20/2018 10:34:25 AM This report has been signed electronically. Number of Addenda: 0 Note Initiated On: 09/20/2018 9:52 AM Scope Withdrawal Time: 0 hours 14 minutes 10 seconds  Total Procedure Duration: 0 hours 22 minutes 0 seconds       [facility]

## 2018-09-20 NOTE — H&P (Signed)
Primary Care Physician:  Kirk Ruths, MD Primary Gastroenterologist:  Dr. Vira Agar  Pre-Procedure History & Physical: HPI:  Manuel Buck is a 68 y.o. male is here for an colonoscopy.   Past Medical History:  Diagnosis Date  . Cancer ([place])    tongue cancer 2016  . Diabetes mellitus without complication ([place])    Diet Controlled  . GERD (gastroesophageal reflux disease)   . Hypertension   . Skipped heart beats    occassionally-pt asymptomatic 01-21-2015)    Past Surgical History:  Procedure Laterality Date  . ANKLE FRACTURE SURGERY    . DIRECT LARYNGOSCOPY N/A 01/22/2015   Procedure: DIRECT LARYNGOSCOPY/LARYNGOSCOPY WITH BIOPSY;  Surgeon: Clyde Canterbury, MD;  Location: [facility] ORS;  Service: ENT;  Laterality: N/A;  . NASAL ENDOSCOPY    . SHOULDER ARTHROSCOPY Right   . SKIN GRAFT    . TONSILLECTOMY      Prior to Admission medications   Medication Sig Start Date End Date Taking? Authorizing Provider  amLODipine (NORVASC) 5 MG tablet Take by mouth. 08/09/16 09/20/18 Yes [provider]  ferrous sulfate 325 (65 FE) MG tablet Take 325 mg by mouth daily with breakfast.    Yes [provider]  Lancets MISC    Yes [provider]  olmesartan-hydrochlorothiazide (BENICAR HCT) 20-12.5 MG tablet Take 1 tablet by mouth daily. 06/09/18  Yes [provider]  acetaminophen (TYLENOL) 500 MG tablet Take 500 mg by mouth every 6 (six) hours as needed for mild pain or moderate pain.    [provider]  aspirin EC 81 MG tablet Take by mouth.    [provider]  cyanocobalamin 500 MCG tablet Take 1,000 mcg by mouth daily.     [provider]  MULTIPLE VITAMIN PO Take 1 tablet by mouth.    [provider]    Allergies as of 07/28/2018 - Review Complete 07/19/2018  Allergen Reaction Noted  . Statins Other (See Comments) and Rash 01/21/2015  . Sulfa antibiotics Rash 01/27/2015    History reviewed. No pertinent family  history.  Social History   Socioeconomic History  . Marital status: Single    Spouse name: Not on file  . Number of children: Not on file  . Years of education: Not on file  . Highest education level: Not on file  Occupational History  . Not on file  Social Needs  . Financial resource strain: Not on file  . Food insecurity:    Worry: Not on file    Inability: Not on file  . Transportation needs:    Medical: Not on file    Non-medical: Not on file  Tobacco Use  . Smoking status: Former Smoker    Last attempt to quit: 01/20/1994    Years since quitting: 24.6  . Smokeless tobacco: Never Used  Substance and Sexual Activity  . Alcohol use: No  . Drug use: No  . Sexual activity: Not on file  Lifestyle  . Physical activity:    Days per week: Not on file    Minutes per session: Not on file  . Stress: Not on file  Relationships  . Social connections:    Talks on phone: Not on file    Gets together: Not on file    Attends religious service: Not on file    Active member of club or organization: Not on file    Attends meetings of clubs or organizations: Not on file    Relationship status: Not on  file  . Intimate partner violence:    Fear of current or ex partner: Not on file    Emotionally abused: Not on file    Physically abused: Not on file    Forced sexual activity: Not on file  Other Topics Concern  . Not on file  Social History Narrative  . Not on file    Review of Systems: See HPI, otherwise negative ROS  Physical Exam: BP 110/70   Pulse 63   Temp (!) 97 F (36.1 C) (Tympanic)   Resp 16   Ht 6' (1.829 m)   Wt 85.3 kg   SpO2 99%   BMI 25.50 kg/m  General:   Alert,  pleasant and cooperative in NAD Head:  Normocephalic and atraumatic. Neck:  Supple; no masses or thyromegaly. Lungs:  Clear throughout to auscultation.    Heart:  Regular rate and rhythm. Abdomen:  Soft, nontender and nondistended. Normal bowel sounds, without guarding, and without rebound.    Neurologic:  Alert and  oriented x4;  grossly normal neurologically.  Impression/Plan: Manuel Buck is here for an colonoscopy to be performed for [facility] colon polyps.  04/04/2013 was date of last colonoscopy had adenomatous and hyperplastic polyps.  Risks, benefits, limitations, and alternatives regarding  colonoscopy have been reviewed with the patient.  Questions have been answered.  All parties agreeable.   Gaylyn Cheers, MD  09/20/2018, 10:01 AM

## 2018-09-20 NOTE — Transfer of Care (Signed)
Immediate Anesthesia Transfer of Care Note  Patient: Manuel Buck  Procedure(s) Performed: COLONOSCOPY WITH PROPOFOL (N/A )  Patient Location: PACU  Anesthesia Type:General  Level of Consciousness: awake, alert  and oriented  Airway & Oxygen Therapy: Patient Spontanous Breathing and Patient connected to nasal cannula oxygen  Post-op Assessment: Report given to RN and Post -op Vital signs reviewed and stable  Post vital signs: Reviewed and stable  Last Vitals:  Vitals Value Taken Time  BP 134/73 09/20/2018 10:39 AM  Temp    Pulse 79 09/20/2018 10:39 AM  Resp 15 09/20/2018 10:39 AM  SpO2 97 % 09/20/2018 10:39 AM  Vitals shown include unvalidated device data.  Last Pain:  Vitals:   09/20/18 0904  TempSrc: Tympanic         Complications: No apparent anesthesia complications

## 2018-09-20 NOTE — Anesthesia Preprocedure Evaluation (Signed)
Anesthesia Evaluation  Patient identified by MRN, date of birth, ID band Patient awake    Reviewed: Allergy & Precautions, NPO status , Patient's Chart, lab work & pertinent test results  History of Anesthesia Complications Negative for: history of anesthetic complications  Airway Mallampati: II  TM Distance: >3 FB Neck ROM: Full    Dental  (+) Upper Dentures, Edentulous Lower   Pulmonary neg sleep apnea, neg COPD, former smoker,    breath sounds clear to auscultation- rhonchi (-) wheezing      Cardiovascular hypertension, Pt. on medications (-) CAD, (-) Past MI, (-) Cardiac Stents and (-) CABG  Rhythm:Regular Rate:Normal - Systolic murmurs and - Diastolic murmurs    Neuro/Psych neg Seizures negative neurological ROS  negative psych ROS   GI/Hepatic Neg liver ROS, GERD  ,  Endo/Other  diabetes (diet controlled)  Renal/GU negative Renal ROS     Musculoskeletal   Abdominal (+) - obese,   Peds  Hematology  (+) anemia ,   Anesthesia Other Findings Past Medical History: No date: Cancer ([place])     Comment:  tongue cancer 2016 No date: Diabetes mellitus without complication (HCC)     Comment:  Diet Controlled No date: GERD (gastroesophageal reflux disease) No date: Hypertension No date: Skipped heart beats     Comment:  occassionally-pt asymptomatic 01-21-2015)   Reproductive/Obstetrics                             Anesthesia Physical Anesthesia Plan  ASA: II  Anesthesia Plan: General   Post-op Pain Management:    Induction: Intravenous  PONV Risk Score and Plan: 1 and Propofol infusion  Airway Management Planned: Natural Airway  Additional Equipment:   Intra-op Plan:   Post-operative Plan:   Informed Consent: I have reviewed the patients History and Physical, chart, labs and discussed the procedure including the risks, benefits and alternatives for the proposed anesthesia with  the patient or authorized representative who has indicated his/her understanding and acceptance.   Dental advisory given  Plan Discussed with: CRNA and Anesthesiologist  Anesthesia Plan Comments:         Anesthesia Quick Evaluation

## 2018-09-20 NOTE — Anesthesia Post-op Follow-up Note (Signed)
Anesthesia QCDR form completed.        

## 2018-09-21 ENCOUNTER — Encounter: Payer: Self-pay | Admitting: Unknown Physician Specialty

## 2018-09-21 LAB — SURGICAL PATHOLOGY

## 2018-10-23 ENCOUNTER — Ambulatory Visit (INDEPENDENT_AMBULATORY_CARE_PROVIDER_SITE_OTHER): Payer: PPO | Admitting: Vascular Surgery

## 2018-10-23 ENCOUNTER — Encounter (INDEPENDENT_AMBULATORY_CARE_PROVIDER_SITE_OTHER): Payer: PPO

## 2019-01-08 DIAGNOSIS — E119 Type 2 diabetes mellitus without complications: Secondary | ICD-10-CM | POA: Diagnosis not present

## 2019-01-08 DIAGNOSIS — I739 Peripheral vascular disease, unspecified: Secondary | ICD-10-CM | POA: Diagnosis not present

## 2019-01-08 DIAGNOSIS — I10 Essential (primary) hypertension: Secondary | ICD-10-CM | POA: Diagnosis not present

## 2019-01-08 DIAGNOSIS — E782 Mixed hyperlipidemia: Secondary | ICD-10-CM | POA: Diagnosis not present

## 2019-01-08 DIAGNOSIS — Z125 Encounter for screening for malignant neoplasm of prostate: Secondary | ICD-10-CM | POA: Diagnosis not present

## 2019-01-16 ENCOUNTER — Encounter: Payer: Self-pay | Admitting: Unknown Physician Specialty

## 2019-03-10 ENCOUNTER — Other Ambulatory Visit: Payer: Self-pay | Admitting: Family Medicine

## 2019-03-10 DIAGNOSIS — Z20822 Contact with and (suspected) exposure to covid-19: Secondary | ICD-10-CM

## 2019-03-16 LAB — NOVEL CORONAVIRUS, NAA: SARS-CoV-2, NAA: NOT DETECTED

## 2019-06-12 DIAGNOSIS — E119 Type 2 diabetes mellitus without complications: Secondary | ICD-10-CM | POA: Diagnosis not present

## 2019-06-12 DIAGNOSIS — M791 Myalgia, unspecified site: Secondary | ICD-10-CM | POA: Diagnosis not present

## 2019-06-12 DIAGNOSIS — Z0000 Encounter for general adult medical examination without abnormal findings: Secondary | ICD-10-CM | POA: Diagnosis not present

## 2019-06-12 DIAGNOSIS — I739 Peripheral vascular disease, unspecified: Secondary | ICD-10-CM | POA: Diagnosis not present

## 2019-06-12 DIAGNOSIS — R0789 Other chest pain: Secondary | ICD-10-CM | POA: Diagnosis not present

## 2019-06-12 DIAGNOSIS — I10 Essential (primary) hypertension: Secondary | ICD-10-CM | POA: Diagnosis not present

## 2019-06-26 DIAGNOSIS — E119 Type 2 diabetes mellitus without complications: Secondary | ICD-10-CM | POA: Diagnosis not present

## 2019-06-26 DIAGNOSIS — I10 Essential (primary) hypertension: Secondary | ICD-10-CM | POA: Diagnosis not present

## 2019-06-26 DIAGNOSIS — R002 Palpitations: Secondary | ICD-10-CM | POA: Diagnosis not present

## 2019-06-26 DIAGNOSIS — E782 Mixed hyperlipidemia: Secondary | ICD-10-CM | POA: Diagnosis not present

## 2019-06-26 DIAGNOSIS — I493 Ventricular premature depolarization: Secondary | ICD-10-CM | POA: Diagnosis not present

## 2019-07-09 DIAGNOSIS — I493 Ventricular premature depolarization: Secondary | ICD-10-CM | POA: Diagnosis not present

## 2019-07-18 DIAGNOSIS — R002 Palpitations: Secondary | ICD-10-CM | POA: Diagnosis not present

## 2019-07-18 DIAGNOSIS — I493 Ventricular premature depolarization: Secondary | ICD-10-CM | POA: Diagnosis not present

## 2019-07-25 DIAGNOSIS — R6889 Other general symptoms and signs: Secondary | ICD-10-CM | POA: Diagnosis not present

## 2019-07-25 DIAGNOSIS — E782 Mixed hyperlipidemia: Secondary | ICD-10-CM | POA: Diagnosis not present

## 2019-07-25 DIAGNOSIS — I493 Ventricular premature depolarization: Secondary | ICD-10-CM | POA: Diagnosis not present

## 2019-07-25 DIAGNOSIS — I10 Essential (primary) hypertension: Secondary | ICD-10-CM | POA: Diagnosis not present

## 2019-07-25 DIAGNOSIS — Z8249 Family history of ischemic heart disease and other diseases of the circulatory system: Secondary | ICD-10-CM | POA: Diagnosis not present

## 2019-07-26 ENCOUNTER — Other Ambulatory Visit: Payer: Self-pay | Admitting: Cardiology

## 2019-07-26 DIAGNOSIS — Z01818 Encounter for other preprocedural examination: Secondary | ICD-10-CM

## 2019-07-27 ENCOUNTER — Other Ambulatory Visit
Admission: RE | Admit: 2019-07-27 | Discharge: 2019-07-27 | Disposition: A | Discharge status: Home / Self Care | Payer: PPO | Source: Ambulatory Visit | Attending: Cardiology | Admitting: Cardiology

## 2019-07-27 ENCOUNTER — Other Ambulatory Visit: Payer: Self-pay

## 2019-07-27 DIAGNOSIS — Z01812 Encounter for preprocedural laboratory examination: Secondary | ICD-10-CM | POA: Diagnosis not present

## 2019-07-27 DIAGNOSIS — Z20828 Contact with and (suspected) exposure to other viral communicable diseases: Secondary | ICD-10-CM | POA: Insufficient documentation

## 2019-07-27 DIAGNOSIS — Z01818 Encounter for other preprocedural examination: Secondary | ICD-10-CM | POA: Diagnosis not present

## 2019-07-27 DIAGNOSIS — I10 Essential (primary) hypertension: Secondary | ICD-10-CM | POA: Diagnosis not present

## 2019-07-28 LAB — SARS CORONAVIRUS 2 (TAT 6-24 HRS): SARS Coronavirus 2: NEGATIVE

## 2019-07-30 ENCOUNTER — Other Ambulatory Visit: Payer: Self-pay | Admitting: Cardiology

## 2019-07-30 MED ORDER — SODIUM CHLORIDE 0.9% FLUSH: 3.0000 mL | Freq: Two times a day (BID) | INTRAVENOUS | Status: DC

## 2019-08-01 ENCOUNTER — Ambulatory Visit
Admission: RE | Admit: 2019-08-01 | Discharge: 2019-08-01 | Disposition: A | Discharge status: Home / Self Care | Payer: PPO | Attending: Cardiology | Admitting: Cardiology

## 2019-08-01 ENCOUNTER — Encounter
Admission: RE | Disposition: A | Discharge status: Home / Self Care | Payer: Self-pay | Source: Home / Self Care | Attending: Cardiology

## 2019-08-01 ENCOUNTER — Other Ambulatory Visit: Payer: Self-pay

## 2019-08-01 DIAGNOSIS — E119 Type 2 diabetes mellitus without complications: Secondary | ICD-10-CM | POA: Diagnosis not present

## 2019-08-01 DIAGNOSIS — Z87891 Personal history of nicotine dependence: Secondary | ICD-10-CM | POA: Diagnosis not present

## 2019-08-01 DIAGNOSIS — I493 Ventricular premature depolarization: Secondary | ICD-10-CM | POA: Insufficient documentation

## 2019-08-01 DIAGNOSIS — Z888 Allergy status to other drugs, medicaments and biological substances status: Secondary | ICD-10-CM | POA: Insufficient documentation

## 2019-08-01 DIAGNOSIS — R9439 Abnormal result of other cardiovascular function study: Secondary | ICD-10-CM | POA: Diagnosis not present

## 2019-08-01 DIAGNOSIS — Z79899 Other long term (current) drug therapy: Secondary | ICD-10-CM | POA: Diagnosis not present

## 2019-08-01 DIAGNOSIS — Z8249 Family history of ischemic heart disease and other diseases of the circulatory system: Secondary | ICD-10-CM | POA: Diagnosis not present

## 2019-08-01 DIAGNOSIS — Z7982 Long term (current) use of aspirin: Secondary | ICD-10-CM | POA: Diagnosis not present

## 2019-08-01 DIAGNOSIS — I10 Essential (primary) hypertension: Secondary | ICD-10-CM | POA: Diagnosis not present

## 2019-08-01 DIAGNOSIS — E782 Mixed hyperlipidemia: Secondary | ICD-10-CM | POA: Insufficient documentation

## 2019-08-01 DIAGNOSIS — R079 Chest pain, unspecified: Secondary | ICD-10-CM | POA: Diagnosis not present

## 2019-08-01 DIAGNOSIS — Z882 Allergy status to sulfonamides status: Secondary | ICD-10-CM | POA: Diagnosis not present

## 2019-08-01 HISTORY — PX: LEFT HEART CATH AND CORONARY ANGIOGRAPHY: CATH118249

## 2019-08-01 LAB — GLUCOSE, CAPILLARY
Glucose-Capillary: 101 mg/dL — ABNORMAL HIGH (ref 70–99)
Glucose-Capillary: 84 mg/dL (ref 70–99)

## 2019-08-01 SURGERY — LEFT HEART CATH AND CORONARY ANGIOGRAPHY
Anesthesia: Moderate Sedation | Laterality: Left

## 2019-08-01 MED ORDER — HYDRALAZINE HCL 20 MG/ML IJ SOLN: 10.0000 mg | INTRAMUSCULAR | Status: DC | PRN

## 2019-08-01 MED ORDER — FENTANYL CITRATE (PF) 100 MCG/2ML IJ SOLN
INTRAMUSCULAR | Status: DC | PRN
  Administered 2019-08-01: 50 ug via INTRAVENOUS

## 2019-08-01 MED ORDER — SODIUM CHLORIDE 0.9 % IV SOLN: 250.0000 mL | INTRAVENOUS | Status: DC | PRN

## 2019-08-01 MED ORDER — MIDAZOLAM HCL 2 MG/2ML IJ SOLN
INTRAMUSCULAR | Status: AC
  Filled 2019-08-01: qty 2

## 2019-08-01 MED ORDER — IOHEXOL 300 MG/ML  SOLN
INTRAMUSCULAR | Status: DC | PRN
  Administered 2019-08-01: 66 mL

## 2019-08-01 MED ORDER — FENTANYL CITRATE (PF) 100 MCG/2ML IJ SOLN
INTRAMUSCULAR | Status: AC
  Filled 2019-08-01: qty 2

## 2019-08-01 MED ORDER — HEPARIN (PORCINE) IN NACL 1000-0.9 UT/500ML-% IV SOLN
INTRAVENOUS | Status: AC
  Filled 2019-08-01: qty 1000

## 2019-08-01 MED ORDER — ASPIRIN 81 MG PO CHEW: 81.0000 mg | CHEWABLE_TABLET | ORAL | Status: DC

## 2019-08-01 MED ORDER — MIDAZOLAM HCL 2 MG/2ML IJ SOLN
INTRAMUSCULAR | Status: DC | PRN
  Administered 2019-08-01: 1 mg via INTRAVENOUS

## 2019-08-01 MED ORDER — SODIUM CHLORIDE 0.9 % WEIGHT BASED INFUSION
3.0000 mL/kg/h | INTRAVENOUS | Status: AC
  Administered 2019-08-01: 3 mL/kg/h via INTRAVENOUS

## 2019-08-01 MED ORDER — LABETALOL HCL 5 MG/ML IV SOLN: 10.0000 mg | INTRAVENOUS | Status: DC | PRN

## 2019-08-01 MED ORDER — SODIUM CHLORIDE 0.9% FLUSH: 3.0000 mL | INTRAVENOUS | Status: DC | PRN

## 2019-08-01 MED ORDER — HEPARIN (PORCINE) IN NACL 1000-0.9 UT/500ML-% IV SOLN
INTRAVENOUS | Status: DC | PRN
  Administered 2019-08-01: 500 mL

## 2019-08-01 MED ORDER — SODIUM CHLORIDE 0.9% FLUSH: 3.0000 mL | Freq: Two times a day (BID) | INTRAVENOUS | Status: DC

## 2019-08-01 MED ORDER — SODIUM CHLORIDE 0.9 % WEIGHT BASED INFUSION: 1.0000 mL/kg/h | INTRAVENOUS | Status: DC

## 2019-08-01 MED ORDER — ACETAMINOPHEN 325 MG PO TABS: 650.0000 mg | ORAL_TABLET | ORAL | Status: DC | PRN

## 2019-08-01 MED ORDER — ONDANSETRON HCL 4 MG/2ML IJ SOLN: 4.0000 mg | Freq: Four times a day (QID) | INTRAMUSCULAR | Status: DC | PRN

## 2019-08-01 SURGICAL SUPPLY — 9 items
CATH INFINITI 5FR ANG PIGTAIL (CATHETERS) ×3 IMPLANT
CATH INFINITI 5FR JL4 (CATHETERS) ×3 IMPLANT
CATH INFINITI JR4 5F (CATHETERS) ×3 IMPLANT
DEVICE CLOSURE MYNXGRIP 5F (Vascular Products) ×3 IMPLANT
KIT MANI 3VAL PERCEP (MISCELLANEOUS) ×3 IMPLANT
NEEDLE PERC 18GX7CM (NEEDLE) ×3 IMPLANT
PACK CARDIAC CATH (CUSTOM PROCEDURE TRAY) ×3 IMPLANT
SHEATH AVANTI 5FR X 11CM (SHEATH) ×3 IMPLANT
WIRE GUIDERIGHT .035X150 (WIRE) ×3 IMPLANT

## 2019-08-01 NOTE — H&P (Signed)
Chief Complaint: Chief Complaint  Patient presents with  . Follow-up  echo and holter  Date of Service: 07/25/2019 Date of Birth: October 09, 1950 PCP: Harrold Donath, MD  History of Present Illness: Mr. Fresch is a 68 y.o.male patient with a past medical history significant for type 2 diabetes, hypertension, hyperlipidemia, and family history of coronary artery disease who presents to review Holter monitor and echocardiogram results. He recently underwent an ETT ordered by his PCP which revealed poor exercise tolerance with evidence of exercise induced PVCs. He denies chest pain or shortness of breath while on the treadmill, but was limited by lower extremity weakness, which has been present since beginning chemotherapy. He reports that he has been doing well from a cardiac standpoint and denies chest pain or shortness of breath, however, he recently lost one of his brothers to coronary artery disease, and his other brother recently underwent a cardiac catheterization with PCI.   We discussed the results of the echocardiogram and Holter monitor at today's visit. Echocardiogram revealed mildly reduced LV systolic function with an EF estimated at 50% with basal to mid inferolateral wall hypokinesis. Stress ETT revealed poor exercise tolerance with exercise induced PVCs. Holter monitor revealed predominately sinus rhythm at an average rate of 72bpm with frequent PVCs (3.8% burden) and several nonsustained runs of SVT and ventricular runs.   Past Medical and Surgical History  Past Medical History Past Medical History:  Diagnosis Date  . Diabetes mellitus type 2, uncomplicated (CMS-HCC)  . Hyperlipidemia  . Hypertension   Past Surgical History He has a past surgical history that includes Colonoscopy (04/04/2013); Hernia repair; Right shoulder setting; Right hand fracture; Tonsillectomy; flexible sigmoidoscopy (10/17/2000); and Colonoscopy (09/20/2018).   Medications and Allergies   Current Medications  Current Outpatient Medications  Medication Sig Dispense Refill  . aspirin 81 MG EC tablet Take 1 tablet (81 mg total) by mouth once daily.  . cyanocobalamin (VITAMIN B12) 1000 MCG tablet Take 1,000 mcg by mouth once daily.  . ferrous sulfate 325 (65 FE) MG tablet Take 325 mg by mouth 2 (two) times daily with meals  . lancets Use 1 each once daily. Use as instructed.  . multivitamin tablet Take 1 tablet by mouth once daily.  Marland Kitchen olmesartan-hydrochlorothiazide (BENICAR HCT) 20-12.5 mg tablet Take 1 tablet by mouth once daily 90 tablet 11   No current facility-administered medications for this visit.   Allergies: Statins-hmg-coa reductase inhibitors and Sulfa (sulfonamide antibiotics)  Social and Family History  Social History reports that he has quit smoking. He has quit using smokeless tobacco. He reports that he does not drink alcohol.  Family History Family History  Problem Relation Age of Onset  . Lung cancer Father  . Aneurysm Sister  . Diabetes type II Brother   Review of Systems   Review of Systems: The patient denies chest pain, shortness of breath, orthopnea, paroxysmal nocturnal dyspnea, pedal edema, palpitations, heart racing, fatigue, dizziness, lightheadedness, presyncope, syncope, leg pain, leg cramping. Review of 12 Systems is negative except as described in HPI.   Physical Examination   Vitals:BP 120/64  Pulse 53  Resp 16  Ht 182.9 cm (6')  Wt 89.4 kg (197 lb)  SpO2 98%  BMI 26.72 kg/m  Ht:182.9 cm (6') Wt:89.4 kg (197 lb) [id number] surface area is 2.13 meters squared. Body mass index is 26.72 kg/m.  General: Well developed, well nourished. In no acute distress HEENT: Pupils equally reactive to light and accomodation  Neck: Supple without thyromegaly, or goiter. Carotid  pulses 2+. No carotid bruits present.  Pulmonary: Clear to auscultation bilaterally; no wheezes, rales, rhonchi Cardiovascular: Bradycardic with frequent ectopic beats.  No gallops, murmurs or rubs Gastrointestinal: Soft nontender, nondistended, with normal bowel sounds Extremities: No cyanosis, clubbing, or edema Peripheral Pulses: 2+ in upper extremities, 2+ in lower extremities  Neurology: Alert and oriented X3 Pysch: Good affect. Responds appropriately  Assessment and Plan   68 y.o. male with  1. Essential hypertension  -Continue olmesartan-HCTZ 20-12.5mg  once daily  2. PVC (premature ventricular contraction)  -Fairly asymptomatic; will defer consideration of beta blocker therapy as patient is relatively bradycardic  -Further plan pending cath results  3. Mixed hyperlipidemia  -Continue healthy, low fat diet  4. Hypokinesis  -Echocardiogram revealing basal to mid inferolateral wall hypokinesis with high PVC burden and non-sustained runs of ventricular tachycardia; due to this and risk factors of hypertension, hyperlipidemia, and significant family history of CAD, will further assess with cardiac catheterization  -Risks and benefits of the procedure were discussed in thorough detail including risks of bleeding, infection, heart attack, stroke, and death  5. Family history of coronary artery disease  -See above     No orders of the defined types were placed in this encounter.  Return in about 4 weeks (around 08/22/2019).   Javier Docker Hipolito Martinezlopez MD  Pt seen and examined. No change from above.

## 2019-08-01 NOTE — Progress Notes (Signed)
Patient states he took 81mg  aspirin at 1030 today

## 2019-08-01 NOTE — Discharge Instructions (Signed)
Femoral Site Care °This sheet gives you information about how to care for yourself after your procedure. Your health care provider may also give you more specific instructions. If you have problems or questions, contact your health care provider. °What can I expect after the procedure? °After the procedure, it is common to have: °· Bruising that usually fades within 1-2 weeks. °· Tenderness at the site. °Follow these instructions at home: °Wound care °· Follow instructions from your health care provider about how to take care of your insertion site. Make sure you: °? Wash your hands with soap and water before you change your bandage (dressing). If soap and water are not available, use hand sanitizer. °? Change your dressing as told by your health care provider. °? Leave stitches (sutures), skin glue, or adhesive strips in place. These skin closures may need to stay in place for 2 weeks or longer. If adhesive strip edges start to loosen and curl up, you may trim the loose edges. Do not remove adhesive strips completely unless your health care provider tells you to do that. °· Do not take baths, swim, or use a hot tub until your health care provider approves. °· You may shower 24-48 hours after the procedure or as told by your health care provider. °? Gently wash the site with plain soap and water. °? Pat the area dry with a clean towel. °? Do not rub the site. This may cause bleeding. °· Do not apply powder or lotion to the site. Keep the site clean and dry. °· Check your femoral site every day for signs of infection. Check for: °? Redness, swelling, or pain. °? Fluid or blood. °? Warmth. °? Pus or a bad smell. °Activity °· For the first 2-3 days after your procedure, or as long as directed: °? Avoid climbing stairs as much as possible. °? Do not squat. °· Do not lift anything that is heavier than 10 lb (4.5 kg), or the limit that you are told, until your health care provider says that it is safe. °· Rest as  directed. °? Avoid sitting for a long time without moving. Get up to take short walks every 1-2 hours. °· Do not drive for 24 hours if you were given a medicine to help you relax (sedative). °General instructions °· Take over-the-counter and prescription medicines only as told by your health care provider. °· Keep all follow-up visits as told by your health care provider. This is important. °Contact a health care provider if you have: °· A fever or chills. °· You have redness, swelling, or pain around your insertion site. °Get help right away if: °· The catheter insertion area swells very fast. °· You pass out. °· You suddenly start to sweat or your skin gets clammy. °· The catheter insertion area is bleeding, and the bleeding does not stop when you hold steady pressure on the area. °· The area near or just beyond the catheter insertion site becomes pale, cool, tingly, or numb. °These symptoms may represent a serious problem that is an emergency. Do not wait to see if the symptoms will go away. Get medical help right away. Call your local emergency services (911 in the [place] Do not drive yourself to the hospital. °Summary °· After the procedure, it is common to have bruising that usually fades within 1-2 weeks. °· Check your femoral site every day for signs of infection. °· Do not lift anything that is heavier than 10 lb (4.5 kg), or the   limit that you are told, until your health care provider says that it is safe. °This information is not intended to replace advice given to you by your health care provider. Make sure you discuss any questions you have with your health care provider. °Document Released: 05/03/2014 Document Revised: 09/12/2017 Document Reviewed: 09/12/2017 °Elsevier Patient Education © 2020 [employer] °Moderate Conscious Sedation, Adult, Care After °These instructions provide you with information about caring for yourself after your procedure. Your health care provider may also give you more  specific instructions. Your treatment has been planned according to current medical practices, but problems sometimes occur. Call your health care provider if you have any problems or questions after your procedure. °What can I expect after the procedure? °After your procedure, it is common: °· To feel sleepy for several hours. °· To feel clumsy and have poor balance for several hours. °· To have poor judgment for several hours. °· To vomit if you eat too soon. °Follow these instructions at home: °For at least 24 hours after the procedure: ° °· Do not: °? Participate in activities where you could fall or become injured. °? Drive. °? Use heavy machinery. °? Drink alcohol. °? Take sleeping pills or medicines that cause drowsiness. °? Make important decisions or sign legal documents. °? Take care of children on your own. °· Rest. °Eating and drinking °· Follow the diet recommended by your health care provider. °· If you vomit: °? Drink water, juice, or soup when you can drink without vomiting. °? Make sure you have little or no nausea before eating solid foods. °General instructions °· Have a responsible adult stay with you until you are awake and alert. °· Take over-the-counter and prescription medicines only as told by your health care provider. °· If you smoke, do not smoke without supervision. °· Keep all follow-up visits as told by your health care provider. This is important. °Contact a health care provider if: °· You keep feeling nauseous or you keep vomiting. °· You feel light-headed. °· You develop a rash. °· You have a fever. °Get help right away if: °· You have trouble breathing. °This information is not intended to replace advice given to you by your health care provider. Make sure you discuss any questions you have with your health care provider. °Document Released: 06/20/2013 Document Revised: 08/12/2017 Document Reviewed: 12/20/2015 °Elsevier Patient Education © 2020 [employer] °Coronary Angiogram °A  coronary angiogram is an X-ray procedure that is used to examine the arteries in the heart. In this procedure, a dye (contrast dye) is injected through a long, thin tube (catheter). The catheter is inserted through the groin, wrist, or arm. The dye is injected into each artery, then X-rays are taken to show if there is a blockage in the arteries of the heart. This procedure can also show if you have valve disease or a disease of the aorta, and it can be used to check the overall function of your heart muscle. You may have a coronary angiogram if: °· You are having chest pain, or other symptoms of angina, and you are at risk for heart disease. °· You have an abnormal electrocardiogram (ECG) or stress test. °· You have chest pain and heart failure. °· You are having irregular heart rhythms. °· You and your health care provider determine that the benefits of the test information outweigh the risks of the procedure. °Let your health care provider know about: °· Any allergies you have, including allergies to contrast dye. °·   All medicines you are taking, including vitamins, herbs, eye drops, creams, and over-the-counter medicines. °· Any problems you or family members have had with anesthetic medicines. °· Any blood disorders you have. °· Any surgeries you have had. °· History of kidney problems or kidney failure. °· Any medical conditions you have. °· Whether you are pregnant or may be pregnant. °What are the risks? °Generally, this is a safe procedure. However, problems may occur, including: °· Infection. °· Allergic reaction to medicines or dyes that are used. °· Bleeding from the access site or other locations. °· Kidney injury, especially in people with impaired kidney function. °· Stroke (rare). °· Heart attack (rare). °· Damage to other structures or organs. °What happens before the procedure? °Staying hydrated °Follow instructions from your health care provider about hydration, which may include: °· Up to 2 hours  before the procedure - you may continue to drink clear liquids, such as water, clear fruit juice, black coffee, and plain tea. °Eating and drinking restrictions °Follow instructions from your health care provider about eating and drinking, which may include: °· 8 hours before the procedure - stop eating heavy meals or foods such as meat, fried foods, or fatty foods. °· 6 hours before the procedure - stop eating light meals or foods, such as toast or cereal. °· 2 hours before the procedure - stop drinking clear liquids. °General instructions °· Ask your health care provider about: °? Changing or stopping your regular medicines. This is especially important if you are taking diabetes medicines or blood thinners. °? Taking medicines such as ibuprofen. These medicines can thin your blood. Do not take these medicines before your procedure if your health care provider instructs you not to, though aspirin may be recommended prior to coronary angiograms. °· Plan to have someone take you home from the hospital or clinic. °· You may need to have blood tests or X-rays done. °What happens during the procedure? °· An IV tube will be inserted into one of your veins. °· You will be given one or more of the following: °? A medicine to help you relax (sedative). °? A medicine to numb the area where the catheter will be inserted into an artery (local anesthetic). °· To reduce your risk of infection: °? Your health care team will wash or sanitize their hands. °? Your skin will be washed with soap. °? Hair may be removed from the area where the catheter will be inserted. °· You will be connected to a continuous ECG monitor. °· The catheter will be inserted into an artery. The location may be in your groin, in your wrist, or in the fold of your arm (near your elbow). °· A type of X-ray (fluoroscopy) will be used to help guide the catheter to the opening of the blood vessel that is being examined. °· A dye will be injected into the  catheter, and X-rays will be taken. The dye will help to show where any narrowing or blockages are located in the heart arteries. °· Tell your health care provider if you have any chest pain or trouble breathing during the procedure. °· If blockages are found, your health care provider may perform another procedure, such as inserting a coronary stent. °The procedure may vary among health care providers and hospitals. °What happens after the procedure? °· After the procedure, you will need to keep the area still for a few hours, or for as long as told by your health care provider. If the procedure is   done through the groin, you will be instructed to not bend and not cross your legs. °· The insertion site will be checked frequently. °· The pulse in your foot or wrist will be checked frequently. °· You may have additional blood tests, X-rays, and a test that records the electrical activity of your heart (ECG). °· Do not drive for 24 hours if you were given a sedative. °Summary °· A coronary angiogram is an X-ray procedure that is used to look into the arteries in the heart. °· During the procedure, a dye (contrast dye) is injected through a long, thin tube (catheter). The catheter is inserted through the groin, wrist, or arm. °· Tell your health care provider about any allergies you have, including allergies to contrast dye. °· After the procedure, you will need to keep the area still for a few hours, or for as long as told by your health care provider. °This information is not intended to replace advice given to you by your health care provider. Make sure you discuss any questions you have with your health care provider. °Document Released: 03/06/2003 Document Revised: 08/12/2017 Document Reviewed: 06/11/2016 °Elsevier Patient Education © 2020 Elsevier Inc. ° °

## 2019-08-08 DIAGNOSIS — E782 Mixed hyperlipidemia: Secondary | ICD-10-CM | POA: Diagnosis not present

## 2019-08-08 DIAGNOSIS — I10 Essential (primary) hypertension: Secondary | ICD-10-CM | POA: Diagnosis not present

## 2019-08-08 DIAGNOSIS — I493 Ventricular premature depolarization: Secondary | ICD-10-CM | POA: Diagnosis not present

## 2019-08-28 ENCOUNTER — Other Ambulatory Visit: Payer: Self-pay

## 2019-08-29 ENCOUNTER — Ambulatory Visit
Admission: RE | Admit: 2019-08-29 | Discharge: 2019-08-29 | Disposition: A | Discharge status: Home / Self Care | Payer: PPO | Source: Ambulatory Visit | Attending: Radiation Oncology | Admitting: Radiation Oncology

## 2019-10-02 ENCOUNTER — Ambulatory Visit: Payer: PPO | Attending: Internal Medicine

## 2019-10-02 DIAGNOSIS — Z20822 Contact with and (suspected) exposure to covid-19: Secondary | ICD-10-CM

## 2019-10-03 LAB — NOVEL CORONAVIRUS, NAA: SARS-CoV-2, NAA: NOT DETECTED

## 2019-10-04 ENCOUNTER — Telehealth: Payer: Self-pay | Admitting: Internal Medicine

## 2019-10-04 NOTE — Telephone Encounter (Signed)
Negative COVID results given. Patient results "NOT Detected." Caller expressed understanding. ° °

## 2019-12-01 ENCOUNTER — Ambulatory Visit: Payer: PPO | Attending: Internal Medicine

## 2019-12-01 ENCOUNTER — Other Ambulatory Visit: Payer: Self-pay

## 2019-12-01 DIAGNOSIS — Z23 Encounter for immunization: Secondary | ICD-10-CM

## 2019-12-01 NOTE — Progress Notes (Signed)
   Covid-19 Vaccination Clinic  Name:  Manuel Buck    MRN: [id number] DOB: [birth year]  12/01/2019  [name] was observed post Covid-19 immunization for 15 minutes without incident. He was provided with Vaccine Information Sheet and instruction to access the V-Safe system.   [name] was instructed to call 911 with any severe reactions post vaccine: Marland Kitchen Difficulty breathing  . Swelling of face and throat  . A fast heartbeat  . A bad rash all over body  . Dizziness and weakness   Immunizations Administered    Name Date Dose VIS Date Route   Pfizer COVID-19 Vaccine 12/01/2019  8:34 AM 0.3 mL 08/24/2019 Intramuscular   Manufacturer: [place]   Lot: [id number]   [place]: [id number]

## 2019-12-11 DIAGNOSIS — M791 Myalgia, unspecified site: Secondary | ICD-10-CM | POA: Diagnosis not present

## 2019-12-11 DIAGNOSIS — I10 Essential (primary) hypertension: Secondary | ICD-10-CM | POA: Diagnosis not present

## 2019-12-11 DIAGNOSIS — R413 Other amnesia: Secondary | ICD-10-CM | POA: Diagnosis not present

## 2019-12-11 DIAGNOSIS — E782 Mixed hyperlipidemia: Secondary | ICD-10-CM | POA: Diagnosis not present

## 2019-12-11 DIAGNOSIS — T466X5A Adverse effect of antihyperlipidemic and antiarteriosclerotic drugs, initial encounter: Secondary | ICD-10-CM | POA: Diagnosis not present

## 2019-12-11 DIAGNOSIS — I739 Peripheral vascular disease, unspecified: Secondary | ICD-10-CM | POA: Diagnosis not present

## 2019-12-11 DIAGNOSIS — E119 Type 2 diabetes mellitus without complications: Secondary | ICD-10-CM | POA: Diagnosis not present

## 2019-12-11 DIAGNOSIS — Z125 Encounter for screening for malignant neoplasm of prostate: Secondary | ICD-10-CM | POA: Diagnosis not present

## 2019-12-25 ENCOUNTER — Ambulatory Visit: Payer: PPO | Attending: Internal Medicine

## 2019-12-25 DIAGNOSIS — Z23 Encounter for immunization: Secondary | ICD-10-CM

## 2019-12-25 NOTE — Progress Notes (Signed)
   Covid-19 Vaccination Clinic  Name:  Manuel Buck    MRN: [id number] DOB: 1950/12/07  12/25/2019  Manuel Buck was observed post Covid-19 immunization for 15 minutes without incident. He was provided with Vaccine Information Sheet and instruction to access the V-Safe system.   Manuel Buck was instructed to call 911 with any severe reactions post vaccine: Marland Kitchen Difficulty breathing  . Swelling of face and throat  . A fast heartbeat  . A bad rash all over body  . Dizziness and weakness   Immunizations Administered    Name Date Dose VIS Date Route   Pfizer COVID-19 Vaccine 12/25/2019 12:03 PM 0.3 mL 08/24/2019 Intramuscular   Manufacturer: [place]   Lot: [id number]   [place]: [id number]

## 2019-12-27 DIAGNOSIS — E119 Type 2 diabetes mellitus without complications: Secondary | ICD-10-CM | POA: Diagnosis not present

## 2020-02-04 DIAGNOSIS — I10 Essential (primary) hypertension: Secondary | ICD-10-CM | POA: Diagnosis not present

## 2020-02-04 DIAGNOSIS — I493 Ventricular premature depolarization: Secondary | ICD-10-CM | POA: Diagnosis not present

## 2020-02-04 DIAGNOSIS — E119 Type 2 diabetes mellitus without complications: Secondary | ICD-10-CM | POA: Diagnosis not present

## 2020-02-04 DIAGNOSIS — E782 Mixed hyperlipidemia: Secondary | ICD-10-CM | POA: Diagnosis not present

## 2020-02-04 DIAGNOSIS — I739 Peripheral vascular disease, unspecified: Secondary | ICD-10-CM | POA: Diagnosis not present

## 2020-06-17 DIAGNOSIS — I739 Peripheral vascular disease, unspecified: Secondary | ICD-10-CM | POA: Diagnosis not present

## 2020-06-17 DIAGNOSIS — M791 Myalgia, unspecified site: Secondary | ICD-10-CM | POA: Diagnosis not present

## 2020-06-17 DIAGNOSIS — Z23 Encounter for immunization: Secondary | ICD-10-CM | POA: Diagnosis not present

## 2020-06-17 DIAGNOSIS — Z0000 Encounter for general adult medical examination without abnormal findings: Secondary | ICD-10-CM | POA: Diagnosis not present

## 2020-06-17 DIAGNOSIS — T466X5A Adverse effect of antihyperlipidemic and antiarteriosclerotic drugs, initial encounter: Secondary | ICD-10-CM | POA: Diagnosis not present

## 2020-06-17 DIAGNOSIS — I10 Essential (primary) hypertension: Secondary | ICD-10-CM | POA: Diagnosis not present

## 2020-06-17 DIAGNOSIS — H9192 Unspecified hearing loss, left ear: Secondary | ICD-10-CM | POA: Diagnosis not present

## 2020-06-17 DIAGNOSIS — E119 Type 2 diabetes mellitus without complications: Secondary | ICD-10-CM | POA: Diagnosis not present

## 2020-06-27 DIAGNOSIS — H903 Sensorineural hearing loss, bilateral: Secondary | ICD-10-CM | POA: Diagnosis not present

## 2020-06-27 DIAGNOSIS — H6123 Impacted cerumen, bilateral: Secondary | ICD-10-CM | POA: Diagnosis not present

## 2020-06-27 DIAGNOSIS — H6062 Unspecified chronic otitis externa, left ear: Secondary | ICD-10-CM | POA: Diagnosis not present

## 2020-10-27 DIAGNOSIS — H6123 Impacted cerumen, bilateral: Secondary | ICD-10-CM | POA: Diagnosis not present

## 2020-10-27 DIAGNOSIS — Z85810 Personal history of malignant neoplasm of tongue: Secondary | ICD-10-CM | POA: Diagnosis not present

## 2020-10-27 DIAGNOSIS — H6062 Unspecified chronic otitis externa, left ear: Secondary | ICD-10-CM | POA: Diagnosis not present

## 2020-12-16 DIAGNOSIS — E119 Type 2 diabetes mellitus without complications: Secondary | ICD-10-CM | POA: Diagnosis not present

## 2020-12-16 DIAGNOSIS — I10 Essential (primary) hypertension: Secondary | ICD-10-CM | POA: Diagnosis not present

## 2020-12-16 DIAGNOSIS — M791 Myalgia, unspecified site: Secondary | ICD-10-CM | POA: Diagnosis not present

## 2020-12-16 DIAGNOSIS — T466X5A Adverse effect of antihyperlipidemic and antiarteriosclerotic drugs, initial encounter: Secondary | ICD-10-CM | POA: Diagnosis not present

## 2020-12-16 DIAGNOSIS — R413 Other amnesia: Secondary | ICD-10-CM | POA: Diagnosis not present

## 2020-12-16 DIAGNOSIS — I739 Peripheral vascular disease, unspecified: Secondary | ICD-10-CM | POA: Diagnosis not present

## 2021-01-20 DIAGNOSIS — H353122 Nonexudative age-related macular degeneration, left eye, intermediate dry stage: Secondary | ICD-10-CM | POA: Diagnosis not present

## 2021-01-20 DIAGNOSIS — E119 Type 2 diabetes mellitus without complications: Secondary | ICD-10-CM | POA: Diagnosis not present

## 2021-02-16 ENCOUNTER — Other Ambulatory Visit: Payer: Self-pay

## 2021-02-16 ENCOUNTER — Emergency Department
Admission: EM | Admit: 2021-02-16 | Discharge: 2021-02-16 | Disposition: A | Discharge status: Home / Self Care | Payer: PPO | Attending: Emergency Medicine | Admitting: Emergency Medicine

## 2021-02-16 ENCOUNTER — Encounter: Payer: Self-pay | Admitting: Emergency Medicine

## 2021-02-16 ENCOUNTER — Emergency Department: Payer: PPO

## 2021-02-16 DIAGNOSIS — I10 Essential (primary) hypertension: Secondary | ICD-10-CM | POA: Insufficient documentation

## 2021-02-16 DIAGNOSIS — S32010A Wedge compression fracture of first lumbar vertebra, initial encounter for closed fracture: Secondary | ICD-10-CM | POA: Diagnosis not present

## 2021-02-16 DIAGNOSIS — M2578 Osteophyte, vertebrae: Secondary | ICD-10-CM | POA: Diagnosis not present

## 2021-02-16 DIAGNOSIS — S22080A Wedge compression fracture of T11-T12 vertebra, initial encounter for closed fracture: Secondary | ICD-10-CM | POA: Diagnosis not present

## 2021-02-16 DIAGNOSIS — E119 Type 2 diabetes mellitus without complications: Secondary | ICD-10-CM | POA: Diagnosis not present

## 2021-02-16 DIAGNOSIS — S299XXA Unspecified injury of thorax, initial encounter: Secondary | ICD-10-CM | POA: Diagnosis present

## 2021-02-16 DIAGNOSIS — I2510 Atherosclerotic heart disease of native coronary artery without angina pectoris: Secondary | ICD-10-CM | POA: Diagnosis not present

## 2021-02-16 DIAGNOSIS — Z79899 Other long term (current) drug therapy: Secondary | ICD-10-CM | POA: Diagnosis not present

## 2021-02-16 DIAGNOSIS — W01198A Fall on same level from slipping, tripping and stumbling with subsequent striking against other object, initial encounter: Secondary | ICD-10-CM | POA: Insufficient documentation

## 2021-02-16 DIAGNOSIS — M4312 Spondylolisthesis, cervical region: Secondary | ICD-10-CM | POA: Diagnosis not present

## 2021-02-16 DIAGNOSIS — Z7982 Long term (current) use of aspirin: Secondary | ICD-10-CM | POA: Diagnosis not present

## 2021-02-16 DIAGNOSIS — I700 Atherosclerosis of aorta: Secondary | ICD-10-CM | POA: Diagnosis not present

## 2021-02-16 DIAGNOSIS — Z87891 Personal history of nicotine dependence: Secondary | ICD-10-CM | POA: Insufficient documentation

## 2021-02-16 DIAGNOSIS — S0990XA Unspecified injury of head, initial encounter: Secondary | ICD-10-CM | POA: Diagnosis not present

## 2021-02-16 DIAGNOSIS — S199XXA Unspecified injury of neck, initial encounter: Secondary | ICD-10-CM | POA: Diagnosis not present

## 2021-02-16 DIAGNOSIS — K449 Diaphragmatic hernia without obstruction or gangrene: Secondary | ICD-10-CM | POA: Diagnosis not present

## 2021-02-16 DIAGNOSIS — Z85810 Personal history of malignant neoplasm of tongue: Secondary | ICD-10-CM | POA: Insufficient documentation

## 2021-02-16 DIAGNOSIS — M47812 Spondylosis without myelopathy or radiculopathy, cervical region: Secondary | ICD-10-CM | POA: Diagnosis not present

## 2021-02-16 DIAGNOSIS — M545 Low back pain, unspecified: Secondary | ICD-10-CM | POA: Diagnosis not present

## 2021-02-16 MED ORDER — MORPHINE SULFATE (PF) 4 MG/ML IV SOLN
4.0000 mg | Freq: Once | INTRAVENOUS | Status: AC
  Administered 2021-02-16: 4 mg via INTRAVENOUS
  Filled 2021-02-16: qty 1

## 2021-02-16 MED ORDER — OXYCODONE-ACETAMINOPHEN 5-325 MG PO TABS: 1.0000 | ORAL_TABLET | ORAL | 0 refills | Status: DC | PRN

## 2021-02-16 MED ORDER — ONDANSETRON HCL 4 MG/2ML IJ SOLN
4.0000 mg | Freq: Once | INTRAMUSCULAR | Status: AC
  Administered 2021-02-16: 4 mg via INTRAVENOUS
  Filled 2021-02-16: qty 2

## 2021-02-16 NOTE — ED Triage Notes (Signed)
First Nurse Note:  Fall yesterday, tripped over a bucket.  C/O Low Back Pain.  AAOx3.  Skin warm and dry. NAD

## 2021-02-16 NOTE — Progress Notes (Signed)
Orthopedic Tech Progress Note Patient Details:  Manuel Buck 03-Oct-1950 [id number] Called in order to [facility] for a TLSO Patient ID: Manuel Buck, male   DOB: Oct 12, 1950, 70 y.o.   MRN: [id number]   Janit Pagan 02/16/2021, 5:41 PM

## 2021-02-16 NOTE — ED Notes (Signed)
See triage note  Presents with lower back pain  Stats he tripped and fell yesterday

## 2021-02-16 NOTE — Discharge Instructions (Signed)
Follow-up with Dr. Lacinda Axon in 3 weeks.  Take the medication as needed for pain.  Also take a stool softener so do not get constipated.  Wear the back brace until evaluated by Dr. Lacinda Axon

## 2021-02-16 NOTE — ED Provider Notes (Addendum)
[facility] Emergency Department Provider Note  ____________________________________________   Event Date/Time   First MD Initiated Contact with Patient 02/16/21 1322     (approximate)  I have reviewed the triage vital signs and the nursing notes.   HISTORY  Chief Complaint Fall and Back Pain    HPI Manuel Buck is a 70 y.o. male presents emergency department stating that he tripped and fell yesterday landing on his back and hitting his head on the wall.  Patient takes aspirin per day.  Is complaining of severe mid to lower back pain.  He denies any numbness or tingling.  He denies vomiting.  No LOC.    Past Medical History:  Diagnosis Date  . Cancer ([place])    tongue cancer 2016  . Diabetes mellitus without complication ([place])    Diet Controlled  . GERD (gastroesophageal reflux disease)   . Hypertension   . Skipped heart beats    occassionally-pt asymptomatic 01-21-2015)    Patient Active Problem List   Diagnosis Date Noted  . Anemia 02/24/2016  . Malignant neoplasm of base of tongue ([place]) 02/04/2015  . Encounter for general adult medical examination without abnormal findings 12/31/2014  . Peripheral vascular disease ([place]) 04/13/2014  . Diabetes mellitus ([place]) 03/13/2014  . Essential (primary) hypertension 03/13/2014  . Awareness of heartbeats 03/13/2014  . Combined fat and carbohydrate induced hyperlipemia 03/13/2014    Past Surgical History:  Procedure Laterality Date  . ANKLE FRACTURE SURGERY    . COLONOSCOPY WITH PROPOFOL N/A 09/20/2018   Procedure: COLONOSCOPY WITH PROPOFOL;  Surgeon: Manya Silvas, MD;  Location: [facility] ENDOSCOPY;  Service: Endoscopy;  Laterality: N/A;  . DIRECT LARYNGOSCOPY N/A 01/22/2015   Procedure: DIRECT LARYNGOSCOPY/LARYNGOSCOPY WITH BIOPSY;  Surgeon: Clyde Canterbury, MD;  Location: [facility] ORS;  Service: ENT;  Laterality: N/A;  . LEFT HEART CATH AND CORONARY ANGIOGRAPHY Left 08/01/2019   Procedure: LEFT HEART CATH  AND CORONARY ANGIOGRAPHY;  Surgeon: Teodoro Spray, MD;  Location: [place] CV LAB;  Service: Cardiovascular;  Laterality: Left;  . NASAL ENDOSCOPY    . SHOULDER ARTHROSCOPY Right   . SKIN GRAFT    . TONSILLECTOMY      Prior to Admission medications   Medication Sig Start Date End Date Taking? Authorizing Provider  acetaminophen (TYLENOL) 500 MG tablet Take 500 mg by mouth every 6 (six) hours as needed for mild pain or moderate pain.    [provider]  aspirin EC 81 MG tablet Take 81 mg by mouth daily.     [provider]  cyanocobalamin 500 MCG tablet Take 500 mcg by mouth daily.     [provider]  MULTIPLE VITAMIN PO Take 1 tablet by mouth daily.     [provider]  olmesartan-hydrochlorothiazide (BENICAR HCT) 20-12.5 MG tablet Take 1 tablet by mouth daily. 06/09/18   [provider]    Allergies Statins and Sulfa antibiotics  History reviewed. No pertinent family history.  Social History Social History   Tobacco Use  . Smoking status: Former Smoker    Quit date: 01/20/1994    Years since quitting: 27.0  . Smokeless tobacco: Never Used  Vaping Use  . Vaping Use: Never used  Substance Use Topics  . Alcohol use: No  . Drug use: No    Review of Systems  Constitutional: No fever/chills Eyes: No visual changes. ENT: No sore throat. Respiratory: Denies cough Cardiovascular: Denies chest pain Gastrointestinal: Denies abdominal pain Genitourinary: Negative for dysuria.  Musculoskeletal: Positive for back pain. Skin: Negative for rash. Psychiatric: no mood changes,     ____________________________________________   PHYSICAL EXAM:  VITAL SIGNS: ED Triage Vitals  Enc Vitals Group     BP 02/16/21 1241 99/63     Pulse Rate 02/16/21 1241 73     Resp 02/16/21 1241 20     Temp 02/16/21 1241 98.1 F (36.7 C)     Temp Source 02/16/21 1241 Oral     SpO2 02/16/21 1241 97 %     Weight 02/16/21 1240 194 lb (88 kg)      Height 02/16/21 1240 6' (1.829 m)     Head Circumference --      Peak Flow --      Pain Score 02/16/21 1240 8     Pain Loc --      Pain Edu? --      Excl. in [place]? --     Constitutional: Alert and oriented. Well appearing and in no acute distress. Eyes: Conjunctivae are normal.  Head: Atraumatic. Nose: No congestion/rhinnorhea. Mouth/Throat: Mucous membranes are moist.   Neck:  supple no lymphadenopathy noted Cardiovascular: Normal rate, regular rhythm. Heart sounds are normal Respiratory: Normal respiratory effort.  No retractions, lungs c t a  Abd: soft nontender bs normal all 4 quad GU: deferred Musculoskeletal: FROM all extremities, warm and well perfused, mid T-spine to L-spine tender to palpation, 5/5 strength in lower extremities bilaterally Neurologic:  Normal speech and language.  Skin:  Skin is warm, dry and intact. No rash noted. Psychiatric: Mood and affect are normal. Speech and behavior are normal.  ____________________________________________   LABS (all labs ordered are listed, but only abnormal results are displayed)  Labs Reviewed - No data to display ____________________________________________   ____________________________________________  RADIOLOGY  CT of the head, C-spine, T-spine and lumbar spine  ____________________________________________   PROCEDURES  Procedure(s) performed: No  Procedures    ____________________________________________   INITIAL IMPRESSION / ASSESSMENT AND PLAN / ED COURSE  Pertinent labs & imaging results that were available during my care of the patient were reviewed by me and considered in my medical decision making (see chart for details).   Patient 70 year old male presents after fall.  See HPI.  Physical exam shows patient per stable.   DDx: Thoracic fracture, lumbar fracture, subdural hemorrhage, subarachnoid hemorrhage  CT of the head, C-spine, T-spine and lumbar spine   CT of the head C-spine reviewed  by me confirmed by radiology to be negative, CT of the lumbar spine shows a endplate L1 fracture along with a T12 compression fracture  Patient appears to be comfortable with the morphine that he was given.  He will be discharged with pain medication.  Consult to neurosurgery.  Secure message sent to Dr. Lacinda Axon and phone call.  Received consult from Dr. Lacinda Axon.  He advises T SFO brace.  Follow-up in his office in 3 to 4 weeks.  Patient be discharged with pain medication.  ACEA YAGI was evaluated in Emergency Department on 02/16/2021 for the symptoms described in the history of present illness. He was evaluated in the context of the global COVID-19 pandemic, which necessitated consideration that the patient might be at risk for infection with the SARS-CoV-2 virus that causes COVID-19. Institutional protocols and algorithms that pertain to the evaluation of patients at risk for COVID-19 are in a state of rapid change based on information released by regulatory bodies including the CDC and federal and state organizations. These policies and algorithms  were followed during the patient's care in the ED.    As part of my medical decision making, I reviewed the following data within the [place] History obtained from family, Nursing notes reviewed and incorporated, Old chart reviewed, Radiograph reviewed , A consult was requested and obtained from this/these consultant(s) Neurosurgery, , Notes from prior ED visits and [place] Controlled Substance Database  ____________________________________________   FINAL CLINICAL IMPRESSION(S) / ED DIAGNOSES  Final diagnoses:  T12 compression fracture, initial encounter ([place])  Closed compression fracture of body of L1 vertebra ([place])      NEW MEDICATIONS STARTED DURING THIS VISIT:  New Prescriptions   No medications on file     Note:  This document was prepared using Dragon voice recognition software and may include unintentional dictation  errors.    Versie Starks, PA-C 02/16/21 1623    Versie Starks, PA-C 02/16/21 1628    Vanessa Corcovado, MD 02/16/21 Lurena Nida

## 2021-02-16 NOTE — ED Notes (Signed)
Called  for  TLSO  BRACE  PER  SUSAN  [place]  [place]

## 2021-03-02 DIAGNOSIS — H6062 Unspecified chronic otitis externa, left ear: Secondary | ICD-10-CM | POA: Diagnosis not present

## 2021-03-02 DIAGNOSIS — Z85810 Personal history of malignant neoplasm of tongue: Secondary | ICD-10-CM | POA: Diagnosis not present

## 2021-03-02 DIAGNOSIS — Z08 Encounter for follow-up examination after completed treatment for malignant neoplasm: Secondary | ICD-10-CM | POA: Diagnosis not present

## 2021-03-02 DIAGNOSIS — H6123 Impacted cerumen, bilateral: Secondary | ICD-10-CM | POA: Diagnosis not present

## 2021-03-17 DIAGNOSIS — S32009A Unspecified fracture of unspecified lumbar vertebra, initial encounter for closed fracture: Secondary | ICD-10-CM | POA: Diagnosis not present

## 2021-03-17 DIAGNOSIS — Z8781 Personal history of (healed) traumatic fracture: Secondary | ICD-10-CM | POA: Diagnosis not present

## 2021-03-17 DIAGNOSIS — S22080A Wedge compression fracture of T11-T12 vertebra, initial encounter for closed fracture: Secondary | ICD-10-CM | POA: Diagnosis not present

## 2021-03-18 ENCOUNTER — Other Ambulatory Visit: Payer: Self-pay | Admitting: Orthopedic Surgery

## 2021-03-18 DIAGNOSIS — S22000A Wedge compression fracture of unspecified thoracic vertebra, initial encounter for closed fracture: Secondary | ICD-10-CM | POA: Diagnosis not present

## 2021-03-19 ENCOUNTER — Ambulatory Visit: Payer: PPO | Admitting: Anesthesiology

## 2021-03-19 ENCOUNTER — Ambulatory Visit
Admission: RE | Admit: 2021-03-19 | Discharge: 2021-03-19 | Disposition: A | Discharge status: Home / Self Care | Payer: PPO | Source: Ambulatory Visit | Attending: Orthopedic Surgery | Admitting: Orthopedic Surgery

## 2021-03-19 ENCOUNTER — Encounter
Admission: RE | Disposition: A | Discharge status: Home / Self Care | Payer: Self-pay | Source: Ambulatory Visit | Attending: Orthopedic Surgery

## 2021-03-19 ENCOUNTER — Encounter: Payer: Self-pay | Admitting: Orthopedic Surgery

## 2021-03-19 ENCOUNTER — Ambulatory Visit: Payer: PPO

## 2021-03-19 ENCOUNTER — Other Ambulatory Visit: Payer: Self-pay

## 2021-03-19 DIAGNOSIS — S22000A Wedge compression fracture of unspecified thoracic vertebra, initial encounter for closed fracture: Secondary | ICD-10-CM | POA: Diagnosis not present

## 2021-03-19 DIAGNOSIS — Z85810 Personal history of malignant neoplasm of tongue: Secondary | ICD-10-CM | POA: Diagnosis not present

## 2021-03-19 DIAGNOSIS — Z419 Encounter for procedure for purposes other than remedying health state, unspecified: Secondary | ICD-10-CM

## 2021-03-19 DIAGNOSIS — W19XXXA Unspecified fall, initial encounter: Secondary | ICD-10-CM | POA: Insufficient documentation

## 2021-03-19 DIAGNOSIS — Z882 Allergy status to sulfonamides status: Secondary | ICD-10-CM | POA: Diagnosis not present

## 2021-03-19 DIAGNOSIS — S22080D Wedge compression fracture of T11-T12 vertebra, subsequent encounter for fracture with routine healing: Secondary | ICD-10-CM | POA: Diagnosis not present

## 2021-03-19 DIAGNOSIS — Z888 Allergy status to other drugs, medicaments and biological substances status: Secondary | ICD-10-CM | POA: Insufficient documentation

## 2021-03-19 DIAGNOSIS — S22080A Wedge compression fracture of T11-T12 vertebra, initial encounter for closed fracture: Secondary | ICD-10-CM | POA: Diagnosis not present

## 2021-03-19 DIAGNOSIS — Z79899 Other long term (current) drug therapy: Secondary | ICD-10-CM | POA: Diagnosis not present

## 2021-03-19 DIAGNOSIS — Z7982 Long term (current) use of aspirin: Secondary | ICD-10-CM | POA: Diagnosis not present

## 2021-03-19 DIAGNOSIS — M4854XA Collapsed vertebra, not elsewhere classified, thoracic region, initial encounter for fracture: Secondary | ICD-10-CM | POA: Diagnosis not present

## 2021-03-19 HISTORY — PX: KYPHOPLASTY: SHX5884

## 2021-03-19 LAB — GLUCOSE, CAPILLARY
Glucose-Capillary: 99 mg/dL (ref 70–99)
Glucose-Capillary: 80 mg/dL (ref 70–99)

## 2021-03-19 SURGERY — KYPHOPLASTY
Anesthesia: General

## 2021-03-19 MED ORDER — SODIUM CHLORIDE 0.9 % IV SOLN: INTRAVENOUS | Status: DC

## 2021-03-19 MED ORDER — METOCLOPRAMIDE HCL 10 MG PO TABS: 5.0000 mg | ORAL_TABLET | Freq: Three times a day (TID) | ORAL | Status: DC | PRN

## 2021-03-19 MED ORDER — MIDAZOLAM HCL 2 MG/2ML IJ SOLN
INTRAMUSCULAR | Status: AC
  Filled 2021-03-19: qty 2

## 2021-03-19 MED ORDER — OXYCODONE-ACETAMINOPHEN 5-325 MG PO TABS: 1.0000 | ORAL_TABLET | ORAL | 0 refills | Status: DC | PRN

## 2021-03-19 MED ORDER — OXYCODONE HCL 5 MG/5ML PO SOLN
5.0000 mg | Freq: Once | ORAL | Status: AC | PRN
Start: 2021-03-19 — End: 2021-03-19

## 2021-03-19 MED ORDER — FENTANYL CITRATE (PF) 100 MCG/2ML IJ SOLN
INTRAMUSCULAR | Status: AC
  Filled 2021-03-19: qty 2

## 2021-03-19 MED ORDER — ONDANSETRON HCL 4 MG/2ML IJ SOLN
INTRAMUSCULAR | Status: AC
  Filled 2021-03-19: qty 2

## 2021-03-19 MED ORDER — ONDANSETRON HCL 4 MG/2ML IJ SOLN: 4.0000 mg | Freq: Once | INTRAMUSCULAR | Status: DC | PRN

## 2021-03-19 MED ORDER — LIDOCAINE HCL (PF) 1 % IJ SOLN
INTRAMUSCULAR | Status: AC
  Filled 2021-03-19: qty 30

## 2021-03-19 MED ORDER — ACETAMINOPHEN 10 MG/ML IV SOLN
1000.0000 mg | Freq: Once | INTRAVENOUS | Status: DC | PRN
  Administered 2021-03-19: 1000 mg via INTRAVENOUS

## 2021-03-19 MED ORDER — MIDAZOLAM HCL 2 MG/2ML IJ SOLN
INTRAMUSCULAR | Status: DC | PRN
  Administered 2021-03-19 (×2): 1 mg via INTRAVENOUS

## 2021-03-19 MED ORDER — ONDANSETRON HCL 4 MG/2ML IJ SOLN: 4.0000 mg | Freq: Four times a day (QID) | INTRAMUSCULAR | Status: DC | PRN

## 2021-03-19 MED ORDER — DEXMEDETOMIDINE (PRECEDEX) IN NS 20 MCG/5ML (4 MCG/ML) IV SYRINGE
PREFILLED_SYRINGE | INTRAVENOUS | Status: DC | PRN
  Administered 2021-03-19: 4 ug via INTRAVENOUS
  Administered 2021-03-19 (×2): 8 ug via INTRAVENOUS

## 2021-03-19 MED ORDER — OXYCODONE HCL 5 MG PO TABS
5.0000 mg | ORAL_TABLET | Freq: Once | ORAL | Status: AC | PRN
Start: 2021-03-19 — End: 2021-03-19
  Administered 2021-03-19: 5 mg via ORAL

## 2021-03-19 MED ORDER — CHLORHEXIDINE GLUCONATE 0.12 % MT SOLN: 15.0000 mL | Freq: Once | OROMUCOSAL | Status: AC

## 2021-03-19 MED ORDER — DEXMEDETOMIDINE (PRECEDEX) IN NS 20 MCG/5ML (4 MCG/ML) IV SYRINGE
PREFILLED_SYRINGE | INTRAVENOUS | Status: AC
  Filled 2021-03-19: qty 5

## 2021-03-19 MED ORDER — FENTANYL CITRATE (PF) 100 MCG/2ML IJ SOLN
INTRAMUSCULAR | Status: DC | PRN
  Administered 2021-03-19 (×3): 25 ug via INTRAVENOUS

## 2021-03-19 MED ORDER — ORAL CARE MOUTH RINSE: 15.0000 mL | Freq: Once | OROMUCOSAL | Status: AC

## 2021-03-19 MED ORDER — BUPIVACAINE-EPINEPHRINE (PF) 0.5% -1:200000 IJ SOLN
INTRAMUSCULAR | Status: DC | PRN
  Administered 2021-03-19: 50 mL

## 2021-03-19 MED ORDER — ACETAMINOPHEN 10 MG/ML IV SOLN
INTRAVENOUS | Status: AC
  Filled 2021-03-19: qty 100

## 2021-03-19 MED ORDER — CEFAZOLIN SODIUM-DEXTROSE 2-4 GM/100ML-% IV SOLN
2.0000 g | INTRAVENOUS | Status: AC
  Administered 2021-03-19: 2 g via INTRAVENOUS

## 2021-03-19 MED ORDER — CEFAZOLIN SODIUM-DEXTROSE 2-4 GM/100ML-% IV SOLN
INTRAVENOUS | Status: AC
  Filled 2021-03-19: qty 100

## 2021-03-19 MED ORDER — ONDANSETRON HCL 4 MG PO TABS: 4.0000 mg | ORAL_TABLET | Freq: Four times a day (QID) | ORAL | Status: DC | PRN

## 2021-03-19 MED ORDER — BUPIVACAINE-EPINEPHRINE (PF) 0.5% -1:200000 IJ SOLN
INTRAMUSCULAR | Status: AC
  Filled 2021-03-19: qty 30

## 2021-03-19 MED ORDER — FENTANYL CITRATE (PF) 100 MCG/2ML IJ SOLN
25.0000 ug | INTRAMUSCULAR | Status: DC | PRN
  Administered 2021-03-19: 25 ug via INTRAVENOUS

## 2021-03-19 MED ORDER — PROPOFOL 500 MG/50ML IV EMUL
INTRAVENOUS | Status: DC | PRN
  Administered 2021-03-19: 50 ug/kg/min via INTRAVENOUS

## 2021-03-19 MED ORDER — KETAMINE HCL 10 MG/ML IJ SOLN
INTRAMUSCULAR | Status: DC | PRN
  Administered 2021-03-19: 50 mg via INTRAVENOUS

## 2021-03-19 MED ORDER — METOCLOPRAMIDE HCL 5 MG/ML IJ SOLN: 5.0000 mg | Freq: Three times a day (TID) | INTRAMUSCULAR | Status: DC | PRN

## 2021-03-19 MED ORDER — CHLORHEXIDINE GLUCONATE 0.12 % MT SOLN
OROMUCOSAL | Status: AC
  Administered 2021-03-19: 15 mL via OROMUCOSAL
  Filled 2021-03-19: qty 15

## 2021-03-19 MED ORDER — ONDANSETRON HCL 4 MG/2ML IJ SOLN
INTRAMUSCULAR | Status: DC | PRN
  Administered 2021-03-19: 4 mg via INTRAVENOUS

## 2021-03-19 MED ORDER — OXYCODONE HCL 5 MG PO TABS
ORAL_TABLET | ORAL | Status: AC
  Filled 2021-03-19: qty 1

## 2021-03-19 MED ORDER — EPHEDRINE SULFATE 50 MG/ML IJ SOLN
INTRAMUSCULAR | Status: DC | PRN
  Administered 2021-03-19: 10 mg via INTRAVENOUS

## 2021-03-19 MED ORDER — EPHEDRINE 5 MG/ML INJ
INTRAVENOUS | Status: AC
  Filled 2021-03-19: qty 10

## 2021-03-19 SURGICAL SUPPLY — 21 items
ADH SKN CLS APL DERMABOND .7 (GAUZE/BANDAGES/DRESSINGS) ×1
CEMENT KYPHON CX01A KIT/MIXER (Cement) ×2 IMPLANT
DERMABOND ADVANCED (GAUZE/BANDAGES/DRESSINGS) ×1
DERMABOND ADVANCED .7 DNX12 (GAUZE/BANDAGES/DRESSINGS) ×1 IMPLANT
DEVICE BIOPSY BONE KYPHX (INSTRUMENTS) ×2 IMPLANT
DRAPE C-ARM XRAY 36X54 (DRAPES) ×2 IMPLANT
DURAPREP 26ML APPLICATOR (WOUND CARE) ×2 IMPLANT
GAUZE 4X4 16PLY ~~LOC~~+RFID DBL (SPONGE) ×2 IMPLANT
GLOVE SURG SYN 9.0  PF PI (GLOVE) ×1
GLOVE SURG SYN 9.0 PF PI (GLOVE) ×1 IMPLANT
GOWN SRG 2XL LVL 4 RGLN SLV (GOWNS) ×1 IMPLANT
GOWN STRL NON-REIN 2XL LVL4 (GOWNS) ×2
GOWN STRL REUS W/ TWL LRG LVL3 (GOWN DISPOSABLE) ×1 IMPLANT
GOWN STRL REUS W/TWL LRG LVL3 (GOWN DISPOSABLE) ×2
MANIFOLD NEPTUNE II (INSTRUMENTS) ×2 IMPLANT
PACK KYPHOPLASTY (MISCELLANEOUS) ×2 IMPLANT
RENTAL RFA GENERATOR (MISCELLANEOUS) IMPLANT
STRAP SAFETY 5IN WIDE (MISCELLANEOUS) ×2 IMPLANT
SWABSTK COMLB BENZOIN TINCTURE (MISCELLANEOUS) ×2 IMPLANT
TRAY KYPHOPAK 15/3 EXPRESS 1ST (MISCELLANEOUS) ×2 IMPLANT
TRAY KYPHOPAK 20/3 EXPRESS 1ST (MISCELLANEOUS) ×2 IMPLANT

## 2021-03-19 NOTE — Transfer of Care (Signed)
Immediate Anesthesia Transfer of Care Note  Patient: Manuel Buck  Procedure(s) Performed: T 12  KYPHOPLASTY  Patient Location: PACU  Anesthesia Type:General  Level of Consciousness: awake  Airway & Oxygen Therapy: Patient connected to nasal cannula oxygen  Post-op Assessment: Report given to RN and Post -op Vital signs reviewed and stable  Post vital signs: Reviewed and stable  Last Vitals:  Vitals Value Taken Time  BP 129/65   Temp    Pulse 64 03/19/21 1149  Resp 10 03/19/21 1149  SpO2 100 % 03/19/21 1149  Vitals shown include unvalidated device data.  Last Pain:  Vitals:   03/19/21 1042  TempSrc: Temporal      Patients Stated Pain Goal: 0 (37/09/64 3838)  Complications: No notable events documented.

## 2021-03-19 NOTE — Discharge Instructions (Addendum)
Take it easy today and get more active tomorrow. Remove Band-Aids on Saturday then okay to shower Pain medicine as directed Brace as needed Call office if you are having problems  AMBULATORY SURGERY  DISCHARGE INSTRUCTIONS   The drugs that you were given will stay in your system until tomorrow so for the next 24 hours you should not:  Drive an automobile Make any legal decisions Drink any alcoholic beverage   You may resume regular meals tomorrow.  Today it is better to start with liquids and gradually work up to solid foods.  You may eat anything you prefer, but it is better to start with liquids, then soup and crackers, and gradually work up to solid foods.   Please notify your doctor immediately if you have any unusual bleeding, trouble breathing, redness and pain at the surgery site, drainage, fever, or pain not relieved by medication.    Additional Instructions:        Please contact your physician with any problems or Same Day Surgery at [phone], Monday through Friday 6 am to 4 pm, or [place] at [facility] number at [phone].

## 2021-03-19 NOTE — Op Note (Signed)
03/19/2021  11:44 AM  PATIENT:  Manuel Buck   MRN: [id number]   PRE-OPERATIVE DIAGNOSIS:  closed wedge compression fracture of T 12   POST-OPERATIVE DIAGNOSIS:  closed wedge compression fracture of T 12   PROCEDURE:  Procedure(s): KYPHOPLASTY T 12  SURGEON: Laurene Footman, MD   ASSISTANTS: None   ANESTHESIA:   local and MAC   EBL:  No intake/output data recorded.   BLOOD ADMINISTERED:none   DRAINS: none    LOCAL MEDICATIONS USED:  MARCAINE    and XYLOCAINE    SPECIMEN: T12 vertebral body biopsy   DISPOSITION OF SPECIMEN: Pathology   COUNTS:  YES   TOURNIQUET:  * No tourniquets in log *   IMPLANTS: Bone cement   DICTATION: .Dragon Dictation  patient was brought to the operating room and after adequate anesthesia was obtained the patient was placed prone.  C arm was brought in in good visualization of the affected level obtained on both AP and lateral projections.  After patient identification and timeout procedures were completed, local anesthetic was infiltrated with 10 cc 1% Xylocaine infiltrated subcutaneously.  This is done the area on the each side of the planned approach.  The back was then prepped and draped in the usual sterile manner and repeat timeout procedure carried out.  A spinal needle was brought down to the pedicle on the each side of T12 and a 50-50 mix of 1% Xylocaine half percent Sensorcaine with epinephrine total of 20 cc injected on each side.  After allowing this to set a small incision was made and the trocar was advanced into the vertebral body in an extrapedicular fashion.  Biopsy was obtained drilling was carried out balloon inserted with inflation to 4 cc on the right and 2.5 cc on the left.  When the cement was appropriate consistency 4-1/2 cc were injected on the right and 3 cc on the left into the vertebral body without extravasation, good fill superior to inferior endplates and from right to left sides along the inferior endplate.  After the  cement had set the trochar was removed and permanent C-arm views obtained.  The wound was closed with Dermabond followed by Band-Aid   PLAN OF CARE: Discharge home after recovery room   PATIENT DISPOSITION:  PACU - hemodynamically stable.

## 2021-03-19 NOTE — H&P (Signed)
Chief Complaint  Patient presents with   Middle Back - Pain    History of the Present Illness: Manuel Buck is a 70 y.o. male here today.   The patient presents for evaluation of a T12 compression fracture. The patient was referred by Dr. Deetta Perla. He has over 50 percent loss on a recent CT done at [facility]. He has a remote L1 fracture. X-rays were reviewed before he came in. He is having severe pain.  The patient states he fell on the floor on 02/15/2021. He states he has been wearing a brace. He states he has never had back problems before. His back was not very painful this morning, but most mornings he has pain to the middle of his back.   The patient does not have a pacemaker. He is not on a blood thinner.  The patient is employed part-time at [employer].  I have reviewed past medical, surgical, social and family history, and allergies as documented in the EMR.  Past Medical History: Past Medical History:  Diagnosis Date   Diabetes mellitus type 2, uncomplicated (CMS-HCC)   Hyperlipidemia   Hypertension   Past Surgical History: Past Surgical History:  Procedure Laterality Date   COLONOSCOPY 04/04/2013  Dr. Kennis Carina @ [facility] - Adenomatous Polyps: CBF 03/2018; Recall ltr 01/04/2018 (kj)   COLONOSCOPY 09/20/2018  Adenomatous Polyp: CBF 09/2023   FLEXIBLE SIGMOIDOSCOPY 10/17/2000   HERNIA REPAIR   Right hand fracture   Right shoulder setting   TONSILLECTOMY   Past Family History: Family History  Problem Relation Age of Onset   Lung cancer Father   Aneurysm Sister   Diabetes type II Brother   Medications: Current Outpatient Medications Ordered in Epic  Medication Sig Dispense Refill   acetaminophen (TYLENOL) 500 MG tablet Take 500 mg by mouth every 8 (eight) hours as needed for Pain   aspirin 81 MG EC tablet Take 1 tablet (81 mg total) by mouth once daily.   cyanocobalamin (VITAMIN B12) 1000 MCG tablet Take 1,000 mcg by mouth once daily.   multivitamin tablet Take  1 tablet by mouth once daily.   olmesartan-hydrochlorothiazide (BENICAR HCT) 20-12.5 mg tablet Take 1 tablet by mouth once daily 90 tablet 3   oxyCODONE-acetaminophen (PERCOCET) 5-325 mg tablet Take 1 tablet by mouth every 4 (four) hours as needed   No current Epic-ordered facility-administered medications on file.   Allergies: Allergies  Allergen Reactions   Statins-Hmg-Coa Reductase Inhibitors Rash   Sulfa (Sulfonamide Antibiotics) Unknown    Body mass index is 28.1 kg/m.  Review of Systems: A comprehensive 14 point ROS was performed, reviewed, and the pertinent orthopaedic findings are documented in the HPI.  Vitals:  03/18/21 1024  BP: (!) 146/84    General Physical Examination:   General/Constitutional: No apparent distress: well-nourished and well developed. Eyes: Pupils equal, round with synchronous movement. Lungs: Clear to auscultation HEENT: Normal Vascular: No edema, swelling or tenderness, except as noted in detailed exam. Cardiac: Heart rate and rhythm is regular. Integumentary: No impressive skin lesions present, except as noted in detailed exam. Neuro/Psych: Normal mood and affect, oriented to person, place and time.  Musculoskeletal Examination:  On exam, tenderness to T12. No clonus.  Radiographs:  No new imaging studies were obtained today.  Assessment: ICD-10-CM  1. Thoracic compression fracture, closed, initial encounter (CMS-HCC) S22.000A   Plan:  The patient has clinical findings of T12 compression fracture with persistent pain after 1 month.  We discussed the patient's prior x-ray findings.  I explained he has a T12 compression fracture. I recommend T12 kyphoplasty. I explained the surgery and postoperative course in detail.  We will schedule the patient for surgery tomorrow.  Surgical Risks:  The nature of the condition and the proposed procedure has been reviewed in detail with the patient. Surgical versus non-surgical options and  prognosis for recovery have been reviewed and the inherent risks and benefits of each have been discussed including the risks of infection, bleeding, injury to nerves/blood vessels/tendons, incomplete relief of symptoms, persisting pain and/or stiffness, loss of function, complex regional pain syndrome, failure of the procedure, as appropriate.  Teeth: Normal  Attestation: I, Dawn Royse, am documenting for [facility], MD utilizing [place].    Electronically signed by Lauris Poag, MD at 03/18/2021 2:02 PM EDT  Reviewed  H+P. No changes noted.

## 2021-03-19 NOTE — Anesthesia Postprocedure Evaluation (Signed)
Anesthesia Post Note  Patient: Manuel Buck  Procedure(s) Performed: T 12  KYPHOPLASTY  Patient location during evaluation: PACU Anesthesia Type: General Level of consciousness: awake and alert Pain management: pain level controlled Vital Signs Assessment: post-procedure vital signs reviewed and stable Respiratory status: spontaneous breathing, nonlabored ventilation, respiratory function stable and patient connected to nasal cannula oxygen Cardiovascular status: blood pressure returned to baseline and stable Postop Assessment: no apparent nausea or vomiting Anesthetic complications: no   No notable events documented.   Last Vitals:  Vitals:   03/19/21 1253 03/19/21 1305  BP:  (!) 155/87  Pulse: 62   Resp: 15   Temp: (!) 36.3 C   SpO2: 98%     Last Pain:  Vitals:   03/19/21 1253  TempSrc: Temporal  PainSc: 3                  Arita Miss

## 2021-03-19 NOTE — Anesthesia Preprocedure Evaluation (Addendum)
Anesthesia Evaluation  Patient identified by MRN, date of birth, ID band Patient awake    Reviewed: Allergy & Precautions, NPO status , Patient's Chart, lab work & pertinent test results  History of Anesthesia Complications Negative for: history of anesthetic complications  Airway Mallampati: II  TM Distance: >3 FB Neck ROM: Full    Dental  (+) Upper Dentures, Lower Dentures   Pulmonary neg pulmonary ROS, neg sleep apnea, neg COPD, Patient abstained from smoking.Not current smoker, former smoker,    Pulmonary exam normal breath sounds clear to auscultation       Cardiovascular Exercise Tolerance: Good METShypertension, + Peripheral Vascular Disease  (-) CAD and (-) Past MI (-) dysrhythmias  Rhythm:Regular Rate:Normal - Systolic murmurs Clean coronary cath in 2020.   Neuro/Psych negative neurological ROS  negative psych ROS   GI/Hepatic GERD  ,(+)     (-) substance abuse  ,   Endo/Other  diabetes, Well Controlled  Renal/GU negative Renal ROS     Musculoskeletal   Abdominal   Peds  Hematology   Anesthesia Other Findings Past Medical History: No date: [facility])     Comment:  tongue cancer 2016 No date: Diabetes mellitus without complication (HCC)     Comment:  Diet Controlled No date: GERD (gastroesophageal reflux disease) No date: Hypertension No date: Skipped heart beats     Comment:  occassionally-pt asymptomatic 01-21-2015)  Reproductive/Obstetrics                            Anesthesia Physical Anesthesia Plan  ASA: 2  Anesthesia Plan: General   Post-op Pain Management:    Induction: Intravenous  PONV Risk Score and Plan: 2 and Ondansetron, Propofol infusion and TIVA  Airway Management Planned: Nasal Cannula  Additional Equipment: None  Intra-op Plan:   Post-operative Plan:   Informed Consent: I have reviewed the patients History and Physical, chart, labs and  discussed the procedure including the risks, benefits and alternatives for the proposed anesthesia with the patient or authorized representative who has indicated his/her understanding and acceptance.     Dental advisory given  Plan Discussed with: CRNA and Surgeon  Anesthesia Plan Comments: (Discussed risks of anesthesia with patient, including possibility of difficulty with spontaneous ventilation under anesthesia necessitating airway intervention, PONV, and rare risks such as cardiac or respiratory or neurological events. Patient understands.)        Anesthesia Quick Evaluation

## 2021-03-20 ENCOUNTER — Encounter: Payer: Self-pay | Admitting: Orthopedic Surgery

## 2021-04-07 DIAGNOSIS — Z9889 Other specified postprocedural states: Secondary | ICD-10-CM | POA: Diagnosis not present

## 2021-06-18 DIAGNOSIS — T466X5A Adverse effect of antihyperlipidemic and antiarteriosclerotic drugs, initial encounter: Secondary | ICD-10-CM | POA: Diagnosis not present

## 2021-06-18 DIAGNOSIS — M791 Myalgia, unspecified site: Secondary | ICD-10-CM | POA: Diagnosis not present

## 2021-06-18 DIAGNOSIS — E119 Type 2 diabetes mellitus without complications: Secondary | ICD-10-CM | POA: Diagnosis not present

## 2021-06-18 DIAGNOSIS — I739 Peripheral vascular disease, unspecified: Secondary | ICD-10-CM | POA: Diagnosis not present

## 2021-06-18 DIAGNOSIS — M25511 Pain in right shoulder: Secondary | ICD-10-CM | POA: Diagnosis not present

## 2021-06-18 DIAGNOSIS — I10 Essential (primary) hypertension: Secondary | ICD-10-CM | POA: Diagnosis not present

## 2021-06-18 DIAGNOSIS — Z125 Encounter for screening for malignant neoplasm of prostate: Secondary | ICD-10-CM | POA: Diagnosis not present

## 2021-06-18 DIAGNOSIS — Z0000 Encounter for general adult medical examination without abnormal findings: Secondary | ICD-10-CM | POA: Diagnosis not present

## 2021-06-18 DIAGNOSIS — E782 Mixed hyperlipidemia: Secondary | ICD-10-CM | POA: Diagnosis not present

## 2021-06-18 DIAGNOSIS — Z23 Encounter for immunization: Secondary | ICD-10-CM | POA: Diagnosis not present

## 2021-06-29 DIAGNOSIS — M12811 Other specific arthropathies, not elsewhere classified, right shoulder: Secondary | ICD-10-CM | POA: Diagnosis not present

## 2021-08-14 DIAGNOSIS — M75121 Complete rotator cuff tear or rupture of right shoulder, not specified as traumatic: Secondary | ICD-10-CM | POA: Diagnosis not present

## 2021-08-14 DIAGNOSIS — M12811 Other specific arthropathies, not elsewhere classified, right shoulder: Secondary | ICD-10-CM | POA: Diagnosis not present

## 2021-10-02 ENCOUNTER — Other Ambulatory Visit: Payer: Self-pay | Admitting: Surgery

## 2021-10-02 DIAGNOSIS — M75121 Complete rotator cuff tear or rupture of right shoulder, not specified as traumatic: Secondary | ICD-10-CM

## 2021-10-02 DIAGNOSIS — M12811 Other specific arthropathies, not elsewhere classified, right shoulder: Secondary | ICD-10-CM

## 2021-10-16 ENCOUNTER — Ambulatory Visit
Admission: RE | Admit: 2021-10-16 | Discharge: 2021-10-16 | Disposition: A | Discharge status: Home / Self Care | Payer: PPO | Source: Ambulatory Visit | Attending: Surgery | Admitting: Surgery

## 2021-10-16 DIAGNOSIS — M12811 Other specific arthropathies, not elsewhere classified, right shoulder: Secondary | ICD-10-CM | POA: Insufficient documentation

## 2021-10-16 DIAGNOSIS — M75121 Complete rotator cuff tear or rupture of right shoulder, not specified as traumatic: Secondary | ICD-10-CM | POA: Insufficient documentation

## 2021-10-16 DIAGNOSIS — M25511 Pain in right shoulder: Secondary | ICD-10-CM | POA: Diagnosis not present

## 2021-10-28 ENCOUNTER — Other Ambulatory Visit: Payer: Self-pay | Admitting: Surgery

## 2021-11-05 ENCOUNTER — Encounter
Admission: RE | Admit: 2021-11-05 | Discharge: 2021-11-05 | Disposition: A | Discharge status: Home / Self Care | Payer: PPO | Source: Ambulatory Visit | Attending: Surgery | Admitting: Surgery

## 2021-11-05 ENCOUNTER — Other Ambulatory Visit: Payer: Self-pay

## 2021-11-05 VITALS — BP 142/68 | HR 58 | Resp 14 | Ht 72.0 in | Wt 190.0 lb

## 2021-11-05 DIAGNOSIS — Z01818 Encounter for other preprocedural examination: Secondary | ICD-10-CM | POA: Diagnosis not present

## 2021-11-05 LAB — CBC WITH DIFFERENTIAL/PLATELET
Abs Immature Granulocytes: 0.01 10*3/uL (ref 0.00–0.07)
Basophils Absolute: 0 10*3/uL (ref 0.0–0.1)
Basophils Relative: 1 %
Eosinophils Absolute: 0.3 10*3/uL (ref 0.0–0.5)
Eosinophils Relative: 6 %
HCT: 40.9 % (ref 39.0–52.0)
Hemoglobin: 13.8 g/dL (ref 13.0–17.0)
Immature Granulocytes: 0 %
Lymphocytes Relative: 26 %
Lymphs Abs: 1.1 10*3/uL (ref 0.7–4.0)
MCH: 31.7 pg (ref 26.0–34.0)
MCHC: 33.7 g/dL (ref 30.0–36.0)
MCV: 93.8 fL (ref 80.0–100.0)
Monocytes Absolute: 0.4 10*3/uL (ref 0.1–1.0)
Monocytes Relative: 10 %
Neutro Abs: 2.4 10*3/uL (ref 1.7–7.7)
Neutrophils Relative %: 57 %
Platelets: 310 10*3/uL (ref 150–400)
RBC: 4.36 MIL/uL (ref 4.22–5.81)
RDW: 12.9 % (ref 11.5–15.5)
WBC: 4.2 10*3/uL (ref 4.0–10.5)
nRBC: 0 % (ref 0.0–0.2)

## 2021-11-05 LAB — COMPREHENSIVE METABOLIC PANEL
ALT: 22 U/L (ref 0–44)
AST: 31 U/L (ref 15–41)
Albumin: 4.4 g/dL (ref 3.5–5.0)
Alkaline Phosphatase: 51 U/L (ref 38–126)
Anion gap: 7 (ref 5–15)
BUN: 35 mg/dL — ABNORMAL HIGH (ref 8–23)
CO2: 31 mmol/L (ref 22–32)
Calcium: 9.5 mg/dL (ref 8.9–10.3)
Chloride: 98 mmol/L (ref 98–111)
Creatinine, Ser: 1.22 mg/dL (ref 0.61–1.24)
GFR, Estimated: >60 mL/min (ref >60)
Glucose, Bld: 113 mg/dL — ABNORMAL HIGH (ref 70–99)
Potassium: 4 mmol/L (ref 3.5–5.1)
Sodium: 136 mmol/L (ref 135–145)
Total Bilirubin: 1.3 mg/dL — ABNORMAL HIGH (ref 0.3–1.2)
Total Protein: 7.4 g/dL (ref 6.5–8.1)

## 2021-11-05 LAB — SURGICAL PCR SCREEN
MRSA, PCR: NEGATIVE
Staphylococcus aureus: NEGATIVE

## 2021-11-05 LAB — URINALYSIS, ROUTINE W REFLEX MICROSCOPIC
Bacteria, UA: NONE SEEN
Bilirubin Urine: NEGATIVE
Glucose, UA: NEGATIVE mg/dL
Hgb urine dipstick: NEGATIVE
Ketones, ur: NEGATIVE mg/dL
Nitrite: NEGATIVE
Protein, ur: NEGATIVE mg/dL
Specific Gravity, Urine: 1.021 (ref 1.005–1.030)
pH: 5 (ref 5.0–8.0)

## 2021-11-05 LAB — TYPE AND SCREEN
ABO/RH(D): O NEG
Antibody Screen: NEGATIVE

## 2021-11-05 NOTE — Patient Instructions (Signed)
Your procedure is scheduled on: 11/19/21 Report to [place]. To find out your arrival time please call [phone] between 1PM - 3PM on 11/18/21.  Remember: Instructions that are not followed completely may result in serious medical risk, up to and including death, or upon the discretion of your surgeon and anesthesiologist your surgery may need to be rescheduled.     _X__ 1. Do not eat food after midnight the night before your procedure.                 No gum chewing or hard candies. You may drink clear liquids up to 2 hours                 before you are scheduled to arrive for your surgery- DO not drink clear                 liquids within 2 hours of the start of your surgery.                 Clear Liquids include:           Diabetics water only  [place] 2 HOURS PRIOR TO YOUR ARRIVAL THE DAY OF SURGERY  __X__2.  On the morning of surgery brush your teeth with toothpaste and water, you                 may rinse your mouth with mouthwash if you wish.  Do not swallow any              toothpaste of mouthwash.     _X__ 3.  No Alcohol for 24 hours before or after surgery.   _X__ 4.  Do Not Smoke or use e-cigarettes For 24 Hours Prior to Your Surgery.                 Do not use any chewable tobacco products for at least 6 hours prior to                 surgery.  ____  5.  Bring all medications with you on the day of surgery if instructed.   __X__  6.  Notify your doctor if there is any change in your medical condition      (cold, fever, infections).     Do not wear jewelry, make-up, hairpins, clips or nail polish. Do not wear lotions, powders, or perfumes. NO DEODORANT Do not shave body hair 48 hours prior to surgery. Men may shave face and neck. Do not bring valuables to the hospital.    [facility] is not responsible for any belongings or valuables.  Contacts, dentures/partials or body piercings may not be worn into  surgery. Bring a case for your contacts, glasses or hearing aids, a denture cup will be supplied. Leave your suitcase in the car. After surgery it may be brought to your room. For patients admitted to the hospital, discharge time is determined by your treatment team.   Patients discharged the day of surgery will not be allowed to drive home.   Please read over the following fact sheets that you were given:   MRSA Information, CHG soap, Incentive spirometer, Benzoyl peroxide cream  __X__ Take these medicines the morning of surgery with A SIP OF WATER:    1. omeprazole (PRILOSEC) 20 MG capsule  2.   3.   4.  5.  6.  ____ Fleet Enema (as  directed)   __X__ Use CHG Soap/SAGE wipes as directed  ____ Use inhalers on the day of surgery  ____ Stop metformin/Janumet/Farxiga 2 days prior to surgery    ____ Take 1/2 of usual insulin dose the night before surgery. No insulin the morning          of surgery.   ____ Stop Blood Thinners Coumadin/Plavix/Xarelto/Pleta/Pradaxa/Eliquis/Effient/Aspirin  on   Or contact your Surgeon, Cardiologist or Medical Doctor regarding  ability to stop your blood thinners  __X__ Stop Anti-inflammatories 7 days before surgery such as Advil, Ibuprofen, Motrin,  BC or Goodies Powder, Naprosyn, Naproxen, Aleve, Aspirin   MAY CONTINUE TYLENOL  __X__ Stop all herbals and supplements, fish oil or vitamins  until after surgery.    ____ Bring C-Pap to the hospital.    How to Use an Incentive Spirometer An incentive spirometer is a tool that measures how well you are filling your lungs with each breath. Learning to take long, deep breaths using this tool can help you keep your lungs clear and active. This may help to reverse or lessen your chance of developing breathing (pulmonary) problems, especially infection. You may be asked to use a spirometer: After a surgery. If you have a lung problem or a history of smoking. After a long period of time when you have been  unable to move or be active. If the spirometer includes an indicator to show the highest number that you have reached, your health care provider or respiratory therapist will help you set a goal. Keep a log of your progress as told by your health care provider. What are the risks? Breathing too quickly may cause dizziness or cause you to pass out. Take your time so you do not get dizzy or light-headed. If you are in pain, you may need to take pain medicine before doing incentive spirometry. It is harder to take a deep breath if you are having pain. How to use your incentive spirometer  Sit up on the edge of your bed or on a chair. Hold the incentive spirometer so that it is in an upright position. Before you use the spirometer, breathe out normally. Place the mouthpiece in your mouth. Make sure your lips are closed tightly around it. Breathe in slowly and as deeply as you can through your mouth, causing the piston or the ball to rise toward the top of the chamber. Hold your breath for 3-5 seconds, or for as long as possible. If the spirometer includes a coach indicator, use this to guide you in breathing. Slow down your breathing if the indicator goes above the marked areas. Remove the mouthpiece from your mouth and breathe out normally. The piston or ball will return to the bottom of the chamber. Rest for a few seconds, then repeat the steps 10 or more times. Take your time and take a few normal breaths between deep breaths so that you do not get dizzy or light-headed. Do this every 1-2 hours when you are awake. If the spirometer includes a goal marker to show the highest number you have reached (best effort), use this as a goal to work toward during each repetition. After each set of 10 deep breaths, cough a few times. This will help to make sure that your lungs are clear. If you have an incision on your chest or abdomen from surgery, place a pillow or a rolled-up towel firmly against the  incision when you cough. This can help to reduce pain while taking  deep breaths and coughing. General tips When you are able to get out of bed: Walk around often. Continue to take deep breaths and cough in order to clear your lungs. Keep using the incentive spirometer until your health care provider says it is okay to stop using it. If you have been in the hospital, you may be told to keep using the spirometer at home. Contact a health care provider if: You are having difficulty using the spirometer. You have trouble using the spirometer as often as instructed. Your pain medicine is not giving enough relief for you to use the spirometer as told. You have a fever. Get help right away if: You develop shortness of breath. You develop a cough with bloody mucus from the lungs. You have fluid or blood coming from an incision site after you cough. Summary An incentive spirometer is a tool that can help you learn to take long, deep breaths to keep your lungs clear and active. You may be asked to use a spirometer after a surgery, if you have a lung problem or a history of smoking, or if you have been inactive for a long period of time. Use your incentive spirometer as instructed every 1-2 hours while you are awake. If you have an incision on your chest or abdomen, place a pillow or a rolled-up towel firmly against your incision when you cough. This will help to reduce pain. Get help right away if you have shortness of breath, you cough up bloody mucus, or blood comes from your incision when you cough. This information is not intended to replace advice given to you by your health care provider. Make sure you discuss any questions you have with your health care provider. Document Revised: 11/19/2019 Document Reviewed: 11/19/2019 Elsevier Patient Education  [place].

## 2021-11-17 ENCOUNTER — Other Ambulatory Visit
Admission: RE | Admit: 2021-11-17 | Discharge: 2021-11-17 | Disposition: A | Discharge status: Home / Self Care | Payer: PPO | Source: Ambulatory Visit | Attending: Surgery | Admitting: Surgery

## 2021-11-17 ENCOUNTER — Other Ambulatory Visit: Payer: Self-pay

## 2021-11-17 DIAGNOSIS — Z01812 Encounter for preprocedural laboratory examination: Secondary | ICD-10-CM | POA: Diagnosis not present

## 2021-11-17 DIAGNOSIS — Z20822 Contact with and (suspected) exposure to covid-19: Secondary | ICD-10-CM | POA: Diagnosis not present

## 2021-11-17 LAB — SARS CORONAVIRUS 2 (TAT 6-24 HRS): SARS Coronavirus 2: NEGATIVE

## 2021-11-19 ENCOUNTER — Ambulatory Visit: Payer: PPO | Admitting: Urgent Care

## 2021-11-19 ENCOUNTER — Ambulatory Visit: Payer: PPO | Admitting: Anesthesiology

## 2021-11-19 ENCOUNTER — Other Ambulatory Visit: Payer: Self-pay

## 2021-11-19 ENCOUNTER — Encounter: Payer: Self-pay | Admitting: Surgery

## 2021-11-19 ENCOUNTER — Ambulatory Visit
Admission: RE | Admit: 2021-11-19 | Discharge: 2021-11-19 | Disposition: A | Discharge status: Home / Self Care | Payer: PPO | Source: Ambulatory Visit | Attending: Surgery | Admitting: Surgery

## 2021-11-19 ENCOUNTER — Encounter
Admission: RE | Disposition: A | Discharge status: Home / Self Care | Payer: Self-pay | Source: Ambulatory Visit | Attending: Surgery

## 2021-11-19 ENCOUNTER — Ambulatory Visit: Payer: PPO

## 2021-11-19 DIAGNOSIS — M75121 Complete rotator cuff tear or rupture of right shoulder, not specified as traumatic: Secondary | ICD-10-CM | POA: Diagnosis not present

## 2021-11-19 DIAGNOSIS — M19011 Primary osteoarthritis, right shoulder: Secondary | ICD-10-CM | POA: Diagnosis not present

## 2021-11-19 DIAGNOSIS — Z7982 Long term (current) use of aspirin: Secondary | ICD-10-CM | POA: Diagnosis not present

## 2021-11-19 DIAGNOSIS — M75101 Unspecified rotator cuff tear or rupture of right shoulder, not specified as traumatic: Secondary | ICD-10-CM | POA: Diagnosis not present

## 2021-11-19 DIAGNOSIS — I10 Essential (primary) hypertension: Secondary | ICD-10-CM | POA: Diagnosis not present

## 2021-11-19 DIAGNOSIS — G8918 Other acute postprocedural pain: Secondary | ICD-10-CM | POA: Diagnosis not present

## 2021-11-19 DIAGNOSIS — Z96611 Presence of right artificial shoulder joint: Secondary | ICD-10-CM

## 2021-11-19 DIAGNOSIS — Z87891 Personal history of nicotine dependence: Secondary | ICD-10-CM | POA: Insufficient documentation

## 2021-11-19 DIAGNOSIS — E785 Hyperlipidemia, unspecified: Secondary | ICD-10-CM | POA: Insufficient documentation

## 2021-11-19 DIAGNOSIS — E119 Type 2 diabetes mellitus without complications: Secondary | ICD-10-CM | POA: Diagnosis not present

## 2021-11-19 DIAGNOSIS — M7581 Other shoulder lesions, right shoulder: Secondary | ICD-10-CM | POA: Diagnosis not present

## 2021-11-19 DIAGNOSIS — R52 Pain, unspecified: Secondary | ICD-10-CM

## 2021-11-19 DIAGNOSIS — K219 Gastro-esophageal reflux disease without esophagitis: Secondary | ICD-10-CM | POA: Diagnosis not present

## 2021-11-19 DIAGNOSIS — Z79899 Other long term (current) drug therapy: Secondary | ICD-10-CM | POA: Insufficient documentation

## 2021-11-19 DIAGNOSIS — M7521 Bicipital tendinitis, right shoulder: Secondary | ICD-10-CM | POA: Diagnosis not present

## 2021-11-19 DIAGNOSIS — M25511 Pain in right shoulder: Secondary | ICD-10-CM | POA: Diagnosis not present

## 2021-11-19 DIAGNOSIS — M7989 Other specified soft tissue disorders: Secondary | ICD-10-CM | POA: Diagnosis not present

## 2021-11-19 DIAGNOSIS — M12811 Other specific arthropathies, not elsewhere classified, right shoulder: Secondary | ICD-10-CM | POA: Diagnosis not present

## 2021-11-19 HISTORY — PX: REVERSE SHOULDER ARTHROPLASTY: SHX5054

## 2021-11-19 HISTORY — PX: BICEPT TENODESIS: SHX5116

## 2021-11-19 LAB — GLUCOSE, CAPILLARY
Glucose-Capillary: 94 mg/dL (ref 70–99)
Glucose-Capillary: 142 mg/dL — ABNORMAL HIGH (ref 70–99)

## 2021-11-19 LAB — ABO/RH: ABO/RH(D): O NEG

## 2021-11-19 SURGERY — ARTHROPLASTY, SHOULDER, TOTAL, REVERSE
Anesthesia: General | Site: Shoulder | Laterality: Right

## 2021-11-19 MED ORDER — CEFAZOLIN SODIUM-DEXTROSE 2-4 GM/100ML-% IV SOLN
INTRAVENOUS | Status: AC
  Filled 2021-11-19: qty 100

## 2021-11-19 MED ORDER — LIDOCAINE HCL (PF) 2 % IJ SOLN
INTRAMUSCULAR | Status: AC
  Filled 2021-11-19: qty 5

## 2021-11-19 MED ORDER — ONDANSETRON HCL 4 MG PO TABS
4.0000 mg | ORAL_TABLET | Freq: Four times a day (QID) | ORAL | Status: DC | PRN
Start: 2021-11-19 — End: 2021-11-19

## 2021-11-19 MED ORDER — PROPOFOL 10 MG/ML IV BOLUS
INTRAVENOUS | Status: AC
  Filled 2021-11-19: qty 20

## 2021-11-19 MED ORDER — TRANEXAMIC ACID 1000 MG/10ML IV SOLN
INTRAVENOUS | Status: AC
  Filled 2021-11-19: qty 10

## 2021-11-19 MED ORDER — KETOROLAC TROMETHAMINE 15 MG/ML IJ SOLN
15.0000 mg | Freq: Once | INTRAMUSCULAR | Status: AC
Start: 2021-11-19 — End: 2021-11-19

## 2021-11-19 MED ORDER — MIDAZOLAM HCL 2 MG/2ML IJ SOLN
1.0000 mg | Freq: Once | INTRAMUSCULAR | Status: DC
Start: 2021-11-19 — End: 2021-11-19

## 2021-11-19 MED ORDER — ONDANSETRON HCL 4 MG/2ML IJ SOLN: 4.0000 mg | Freq: Four times a day (QID) | INTRAMUSCULAR | Status: DC | PRN

## 2021-11-19 MED ORDER — SUGAMMADEX SODIUM 200 MG/2ML IV SOLN
INTRAVENOUS | Status: DC | PRN
Start: 2021-11-19 — End: 2021-11-19
  Administered 2021-11-19: 200 mg via INTRAVENOUS

## 2021-11-19 MED ORDER — LIDOCAINE HCL (CARDIAC) PF 100 MG/5ML IV SOSY
PREFILLED_SYRINGE | INTRAVENOUS | Status: DC | PRN
  Administered 2021-11-19: 80 mg via INTRAVENOUS

## 2021-11-19 MED ORDER — BUPIVACAINE-EPINEPHRINE (PF) 0.5% -1:200000 IJ SOLN
INTRAMUSCULAR | Status: DC | PRN
  Administered 2021-11-19: 30 mL

## 2021-11-19 MED ORDER — CHLORHEXIDINE GLUCONATE 0.12 % MT SOLN: 15.0000 mL | Freq: Once | OROMUCOSAL | Status: AC

## 2021-11-19 MED ORDER — KETOROLAC TROMETHAMINE 15 MG/ML IJ SOLN: 7.5000 mg | Freq: Four times a day (QID) | INTRAMUSCULAR | Status: DC

## 2021-11-19 MED ORDER — OXYCODONE HCL 5 MG PO TABS: 5.0000 mg | ORAL_TABLET | Freq: Once | ORAL | Status: DC | PRN

## 2021-11-19 MED ORDER — MIDAZOLAM HCL 2 MG/2ML IJ SOLN
INTRAMUSCULAR | Status: AC
  Filled 2021-11-19: qty 2

## 2021-11-19 MED ORDER — BUPIVACAINE HCL (PF) 0.5 % IJ SOLN
INTRAMUSCULAR | Status: AC
  Filled 2021-11-19: qty 10

## 2021-11-19 MED ORDER — FENTANYL CITRATE PF 50 MCG/ML IJ SOSY
PREFILLED_SYRINGE | INTRAMUSCULAR | Status: AC
  Administered 2021-11-19: 07:00:00 50 ug
  Filled 2021-11-19: qty 1

## 2021-11-19 MED ORDER — CEFAZOLIN SODIUM-DEXTROSE 2-4 GM/100ML-% IV SOLN
2.0000 g | Freq: Four times a day (QID) | INTRAVENOUS | Status: DC
  Administered 2021-11-19: 13:00:00 2 g via INTRAVENOUS

## 2021-11-19 MED ORDER — PHENYLEPHRINE HCL-NACL 20-0.9 MG/250ML-% IV SOLN
INTRAVENOUS | Status: AC
  Filled 2021-11-19: qty 250

## 2021-11-19 MED ORDER — ACETAMINOPHEN 10 MG/ML IV SOLN
INTRAVENOUS | Status: DC | PRN
  Administered 2021-11-19: 1000 mg via INTRAVENOUS

## 2021-11-19 MED ORDER — FENTANYL CITRATE (PF) 100 MCG/2ML IJ SOLN: 50.0000 ug | Freq: Once | INTRAMUSCULAR | Status: AC

## 2021-11-19 MED ORDER — SODIUM CHLORIDE FLUSH 0.9 % IV SOLN
INTRAVENOUS | Status: AC
  Filled 2021-11-19: qty 40

## 2021-11-19 MED ORDER — OXYCODONE HCL 5 MG PO TABS: 5.0000 mg | ORAL_TABLET | ORAL | 0 refills | Status: AC | PRN

## 2021-11-19 MED ORDER — ONDANSETRON HCL 4 MG/2ML IJ SOLN
INTRAMUSCULAR | Status: AC
  Filled 2021-11-19: qty 2

## 2021-11-19 MED ORDER — BUPIVACAINE LIPOSOME 1.3 % IJ SUSP
INTRAMUSCULAR | Status: DC | PRN
  Administered 2021-11-19: 13 mL via PERINEURAL
  Administered 2021-11-19: 7 mL via PERINEURAL

## 2021-11-19 MED ORDER — ROCURONIUM BROMIDE 10 MG/ML (PF) SYRINGE
PREFILLED_SYRINGE | INTRAVENOUS | Status: AC
  Filled 2021-11-19: qty 10

## 2021-11-19 MED ORDER — ONDANSETRON HCL 4 MG/2ML IJ SOLN
INTRAMUSCULAR | Status: DC | PRN
  Administered 2021-11-19: 4 mg via INTRAVENOUS

## 2021-11-19 MED ORDER — LIDOCAINE HCL (PF) 1 % IJ SOLN
INTRAMUSCULAR | Status: AC
  Filled 2021-11-19: qty 5

## 2021-11-19 MED ORDER — METOCLOPRAMIDE HCL 5 MG/ML IJ SOLN: 5.0000 mg | Freq: Three times a day (TID) | INTRAMUSCULAR | Status: DC | PRN

## 2021-11-19 MED ORDER — ACETAMINOPHEN 10 MG/ML IV SOLN
INTRAVENOUS | Status: AC
  Filled 2021-11-19: qty 100

## 2021-11-19 MED ORDER — FAMOTIDINE 20 MG PO TABS
ORAL_TABLET | ORAL | Status: AC
  Administered 2021-11-19: 07:00:00 20 mg
  Filled 2021-11-19: qty 1

## 2021-11-19 MED ORDER — SODIUM CHLORIDE 0.9 % IV SOLN: INTRAVENOUS | Status: DC

## 2021-11-19 MED ORDER — BUPIVACAINE LIPOSOME 1.3 % IJ SUSP
INTRAMUSCULAR | Status: AC
  Filled 2021-11-19: qty 20

## 2021-11-19 MED ORDER — CHLORHEXIDINE GLUCONATE 0.12 % MT SOLN
OROMUCOSAL | Status: AC
  Administered 2021-11-19: 07:00:00 15 mL via OROMUCOSAL
  Filled 2021-11-19: qty 15

## 2021-11-19 MED ORDER — ROCURONIUM BROMIDE 100 MG/10ML IV SOLN
INTRAVENOUS | Status: DC | PRN
  Administered 2021-11-19 (×2): 10 mg via INTRAVENOUS
  Administered 2021-11-19: 40 mg via INTRAVENOUS

## 2021-11-19 MED ORDER — PROPOFOL 10 MG/ML IV BOLUS
INTRAVENOUS | Status: DC | PRN
  Administered 2021-11-19: 130 mg via INTRAVENOUS

## 2021-11-19 MED ORDER — FENTANYL CITRATE (PF) 100 MCG/2ML IJ SOLN: 25.0000 ug | INTRAMUSCULAR | Status: DC | PRN

## 2021-11-19 MED ORDER — OXYCODONE HCL 5 MG PO TABS: 5.0000 mg | ORAL_TABLET | ORAL | Status: DC | PRN

## 2021-11-19 MED ORDER — BUPIVACAINE LIPOSOME 1.3 % IJ SUSP
INTRAMUSCULAR | Status: AC
  Filled 2021-11-19: qty 10

## 2021-11-19 MED ORDER — STERILE WATER FOR IRRIGATION IR SOLN
Status: DC | PRN
  Administered 2021-11-19: 1000 mL

## 2021-11-19 MED ORDER — PHENYLEPHRINE 40 MCG/ML (10ML) SYRINGE FOR IV PUSH (FOR BLOOD PRESSURE SUPPORT)
PREFILLED_SYRINGE | INTRAVENOUS | Status: DC | PRN
  Administered 2021-11-19: 80 ug via INTRAVENOUS

## 2021-11-19 MED ORDER — DEXAMETHASONE SODIUM PHOSPHATE 10 MG/ML IJ SOLN
INTRAMUSCULAR | Status: AC
  Filled 2021-11-19: qty 1

## 2021-11-19 MED ORDER — ORAL CARE MOUTH RINSE: 15.0000 mL | Freq: Once | OROMUCOSAL | Status: AC

## 2021-11-19 MED ORDER — PHENYLEPHRINE HCL-NACL 20-0.9 MG/250ML-% IV SOLN
INTRAVENOUS | Status: DC | PRN
  Administered 2021-11-19: 40 ug/min via INTRAVENOUS

## 2021-11-19 MED ORDER — GLYCOPYRROLATE 0.2 MG/ML IJ SOLN
INTRAMUSCULAR | Status: DC | PRN
  Administered 2021-11-19: .2 mg via INTRAVENOUS

## 2021-11-19 MED ORDER — SODIUM CHLORIDE 0.9 % IR SOLN
Status: DC | PRN
  Administered 2021-11-19: 3000 mL

## 2021-11-19 MED ORDER — DEXAMETHASONE SODIUM PHOSPHATE 10 MG/ML IJ SOLN
INTRAMUSCULAR | Status: DC | PRN
  Administered 2021-11-19: 5 mg via INTRAVENOUS

## 2021-11-19 MED ORDER — OXYCODONE HCL 5 MG/5ML PO SOLN: 5.0000 mg | Freq: Once | ORAL | Status: DC | PRN

## 2021-11-19 MED ORDER — BUPIVACAINE-EPINEPHRINE (PF) 0.5% -1:200000 IJ SOLN
INTRAMUSCULAR | Status: AC
  Filled 2021-11-19: qty 30

## 2021-11-19 MED ORDER — TRANEXAMIC ACID 1000 MG/10ML IV SOLN
INTRAVENOUS | Status: DC | PRN
  Administered 2021-11-19: 1000 mg via TOPICAL

## 2021-11-19 MED ORDER — KETOROLAC TROMETHAMINE 15 MG/ML IJ SOLN
INTRAMUSCULAR | Status: AC
  Administered 2021-11-19: 11:00:00 15 mg via INTRAVENOUS
  Filled 2021-11-19: qty 1

## 2021-11-19 MED ORDER — FENTANYL CITRATE (PF) 100 MCG/2ML IJ SOLN
INTRAMUSCULAR | Status: AC
  Filled 2021-11-19: qty 2

## 2021-11-19 MED ORDER — EPHEDRINE SULFATE (PRESSORS) 50 MG/ML IJ SOLN
INTRAMUSCULAR | Status: DC | PRN
Start: 2021-11-19 — End: 2021-11-19
  Administered 2021-11-19: 10 mg via INTRAVENOUS
  Administered 2021-11-19 (×2): 5 mg via INTRAVENOUS

## 2021-11-19 MED ORDER — 0.9 % SODIUM CHLORIDE (POUR BTL) OPTIME
TOPICAL | Status: DC | PRN
  Administered 2021-11-19: 09:00:00 500 mL

## 2021-11-19 MED ORDER — BUPIVACAINE HCL (PF) 0.5 % IJ SOLN
INTRAMUSCULAR | Status: DC | PRN
  Administered 2021-11-19: 3 mL via PERINEURAL
  Administered 2021-11-19: 7 mL via PERINEURAL

## 2021-11-19 MED ORDER — METOCLOPRAMIDE HCL 10 MG PO TABS: 5.0000 mg | ORAL_TABLET | Freq: Three times a day (TID) | ORAL | Status: DC | PRN

## 2021-11-19 MED ORDER — FENTANYL CITRATE (PF) 100 MCG/2ML IJ SOLN
INTRAMUSCULAR | Status: DC | PRN
  Administered 2021-11-19 (×3): 25 ug via INTRAVENOUS

## 2021-11-19 MED ORDER — CEFAZOLIN SODIUM-DEXTROSE 2-4 GM/100ML-% IV SOLN
2.0000 g | INTRAVENOUS | Status: AC
  Administered 2021-11-19: 08:00:00 2 g via INTRAVENOUS

## 2021-11-19 SURGICAL SUPPLY — 71 items
APL PRP STRL LF DISP 70% ISPRP (MISCELLANEOUS) ×1
BASEPLATE GLENOSPHERE 25 (Plate) ×1 IMPLANT
BASEPLATE GLENOSPHERE 25MM (Plate) ×1 IMPLANT
BEARING HUM RET PRLNG 36 +3 (Joint) ×2 IMPLANT
BEARING TRAY HUM SHLDR 36 +3 (Joint) IMPLANT
BIT DRILL TWIST 2.7 (BIT) ×1 IMPLANT
BIT DRILL TWIST 2.7MM (BIT) ×1
BLADE SAW SAG 25X90X1.19 (BLADE) ×3 IMPLANT
BRNG HUM +3 36 RVRS RTN (Joint) ×1 IMPLANT
BRNG HUM +3 36 RVRS SHLDR PRLG (Joint) IMPLANT
CHLORAPREP W/TINT 26 (MISCELLANEOUS) ×3 IMPLANT
COOLER POLAR GLACIER W/PUMP (MISCELLANEOUS) ×3 IMPLANT
COVER BACK TABLE REUSABLE LG (DRAPES) ×3 IMPLANT
DRAPE 3/4 80X56 (DRAPES) ×3 IMPLANT
DRAPE INCISE IOBAN 66X45 STRL (DRAPES) ×3 IMPLANT
DRSG OPSITE POSTOP 4X8 (GAUZE/BANDAGES/DRESSINGS) ×3 IMPLANT
ELECT BLADE 6.5 EXT (BLADE) IMPLANT
ELECT CAUTERY BLADE 6.4 (BLADE) ×3 IMPLANT
ELECT REM PT RETURN 9FT ADLT (ELECTROSURGICAL) ×3
ELECTRODE REM PT RTRN 9FT ADLT (ELECTROSURGICAL) ×1 IMPLANT
GAUZE XEROFORM 1X8 LF (GAUZE/BANDAGES/DRESSINGS) ×3 IMPLANT
GLENOID SPHERE 36MM CVD +3 (Orthopedic Implant) ×2 IMPLANT
GLOVE SRG 8 PF TXTR STRL LF DI (GLOVE) ×2 IMPLANT
GLOVE SURG ENC MOIS LTX SZ7.5 (GLOVE) ×12 IMPLANT
GLOVE SURG ENC MOIS LTX SZ8 (GLOVE) ×12 IMPLANT
GLOVE SURG UNDER LTX SZ8 (GLOVE) ×3 IMPLANT
GLOVE SURG UNDER POLY LF SZ8 (GLOVE) ×6
GOWN STRL REUS W/ TWL LRG LVL3 (GOWN DISPOSABLE) ×1 IMPLANT
GOWN STRL REUS W/ TWL XL LVL3 (GOWN DISPOSABLE) ×1 IMPLANT
GOWN STRL REUS W/TWL LRG LVL3 (GOWN DISPOSABLE) ×3
GOWN STRL REUS W/TWL XL LVL3 (GOWN DISPOSABLE) ×3
IV NS IRRIG 3000ML ARTHROMATIC (IV SOLUTION) ×3 IMPLANT
KIT STABILIZATION SHOULDER (MISCELLANEOUS) ×3 IMPLANT
KIT TURNOVER KIT A (KITS) ×3 IMPLANT
MANIFOLD NEPTUNE II (INSTRUMENTS) ×3 IMPLANT
MASK FACE SPIDER DISP (MASK) ×3 IMPLANT
MAT ABSORB  FLUID 56X50 GRAY (MISCELLANEOUS) ×2
MAT ABSORB FLUID 56X50 GRAY (MISCELLANEOUS) ×1 IMPLANT
NDL MAYO CATGUT SZ1 (NEEDLE) IMPLANT
NDL SAFETY ECLIPSE 18X1.5 (NEEDLE) ×1 IMPLANT
NDL SPNL 20GX3.5 QUINCKE YW (NEEDLE) ×1 IMPLANT
NEEDLE HYPO 18GX1.5 SHARP (NEEDLE) ×3
NEEDLE MAYO CATGUT SZ1 (NEEDLE) IMPLANT
NEEDLE SPNL 20GX3.5 QUINCKE YW (NEEDLE) ×3 IMPLANT
NS IRRIG 500ML POUR BTL (IV SOLUTION) ×3 IMPLANT
PACK ARTHROSCOPY SHOULDER (MISCELLANEOUS) ×3 IMPLANT
PAD ARMBOARD 7.5X6 YLW CONV (MISCELLANEOUS) ×3 IMPLANT
PAD WRAPON POLAR SHDR UNIV (MISCELLANEOUS) ×1 IMPLANT
PIN THREADED REVERSE (PIN) ×2 IMPLANT
PULSAVAC PLUS IRRIG FAN TIP (DISPOSABLE) ×3
SCREW BONE LOCKING 4.75X35X3.5 (Screw) ×2 IMPLANT
SCREW CENTRAL 6.5X40 (Screw) ×2 IMPLANT
SCREW LOCKING 4.75MMX15MM (Screw) ×2 IMPLANT
SCREW LOCKING NS 4.75MMX20MM (Screw) ×4 IMPLANT
SLING ULTRA II LG (MISCELLANEOUS) ×2 IMPLANT
SLING ULTRA II M (MISCELLANEOUS) IMPLANT
SPONGE T-LAP 18X18 ~~LOC~~+RFID (SPONGE) ×6 IMPLANT
STAPLER SKIN PROX 35W (STAPLE) ×3 IMPLANT
STEM HUMERAL MICRO SZ13 (Stem) ×2 IMPLANT
SUT ETHIBOND 0 MO6 C/R (SUTURE) ×3 IMPLANT
SUT FIBERWIRE #2 38 BLUE 1/2 (SUTURE)
SUT VIC AB 0 CT1 36 (SUTURE) ×3 IMPLANT
SUT VIC AB 2-0 CT1 27 (SUTURE) ×6
SUT VIC AB 2-0 CT1 TAPERPNT 27 (SUTURE) ×2 IMPLANT
SUTURE FIBERWR #2 38 BLUE 1/2 (SUTURE) ×4 IMPLANT
SYR 10ML LL (SYRINGE) ×3 IMPLANT
SYR 30ML LL (SYRINGE) ×3 IMPLANT
TIP FAN IRRIG PULSAVAC PLUS (DISPOSABLE) ×1 IMPLANT
TRAY HUMERAL NEUTRAL EXT 6 (Shoulder) ×2 IMPLANT
WATER STERILE IRR 500ML POUR (IV SOLUTION) ×3 IMPLANT
WRAPON POLAR PAD SHDR UNIV (MISCELLANEOUS) ×3

## 2021-11-19 NOTE — Op Note (Signed)
11/19/2021 ? ?10:28 AM ? ?Patient:   Manuel Buck ? ?Pre-Op Diagnosis:   Massive irreparable rotator cuff tear with cuff arthropathy, right shoulder. ? ?Post-Op Diagnosis:   Same ? ?Procedure:   Reverse right total shoulder arthroplasty with biceps tenodesis. ? ?Surgeon:   Pascal Lux, MD ? ?Assistant:   Cameron Proud, PA-C; Reymundo Poll, PA-S ? ?Anesthesia:   General endotracheal with an interscalene block using Exparel placed preoperatively by the anesthesiologist. ? ?Findings:   As above. ? ?Complications:   None ? ?EBL:    100 cc ? ?Fluids:   700 cc crystalloid ? ?UOP:   None ? ?TT:   None ? ?Drains:   None ? ?Closure:   Staples ? ?Implants:   All press-fit [employer] Comprehensive system with a #13 Identity micro-humeral stem, a -6 mm Identity extended neutral humeral tray with a +3 mm insert, and a mini-base plate with a 36 mm +3 mm lateralized glenosphere. ? ?Brief Clinical Note:   The patient is a 71 year old male with a long history of progressively worsening pain and weakness of his right shoulder. His symptoms have progressed despite medications, activity modification, etc. His history and examination consistent with a massive irreparable rotator cuff tear with cuff arthropathy, all of which were confirmed by MRI scan preoperatively. The patient presents at this time for a reverse right total shoulder arthroplasty. ? ?Procedure:   The patient underwent placement of an interscalene block using Exparel by the anesthesiologist in the preoperative holding area before being brought into the operating room and lain in the supine position. The patient then underwent general endotracheal intubation and anesthesia before the patient was repositioned in the beach chair position using the beach chair positioner. The right shoulder and upper extremity were prepped with ChloraPrep solution before being draped sterilely. Preoperative antibiotics were administered. A timeout was performed to verify the  appropriate surgical site.   ? ?A standard anterior approach to the shoulder was made through an approximately 4-5 inch incision. The incision was carried down through the subcutaneous tissues to expose the deltopectoral fascia. The interval between the deltoid and pectoralis muscles was identified and this plane developed, retracting the cephalic vein laterally with the deltoid muscle. The conjoined tendon was identified. Its lateral margin was dissected and the Kolbel self-retraining retractor inserted. The "three sisters" were identified and cauterized. Bursal tissues were removed to improve visualization.  ? ?The biceps tendon was identified near the inferior aspect of the bicipital groove. A soft tissue tenodesis was performed by attaching the biceps tendon to the adjacent pectoralis major tendon using two #0 Ethibond interrupted sutures. The biceps tendon was then transected just proximal to the tenodesis site. The subscapularis tendon was released from its attachment to the lesser tuberosity 1 cm proximal to its insertion and several tagging sutures placed. The inferior capsule was released with care after identifying and protecting the axillary nerve. The proximal humeral cut was made at approximately 25? of retroversion using the extra-medullary guide.  ? ?Attention was redirected to the glenoid. The labrum was debrided circumferentially before the center of the glenoid was marked with electrocautery. The guidewire was drilled into the glenoid neck using the appropriate guide. After verifying its position, it was overreamed with the mini-baseplate reamer to create a flat surface. The permanent mini-baseplate was impacted into place. It was stabilized with a 40 x 6.5 mm central screw and four peripheral locking screws. The permanent 36 mm +3 mm lateralized glenosphere was then impacted into place and  its Morse taper locking mechanism verified using manual distraction. ? ?Attention was directed to the humeral  side. The humeral canal was reamed sequentially beginning with the end-cutting reamer then progressing from a 4 mm reamer up to a 13 mm reamer. This provided excellent circumferential chatter. The canal was broached beginning with a #10 broach and progressing to a #13 broach.  The plastic stem was inserted into the end of the broach and the proximal reaming performed. A trial reduction was performed using the -6 mm Identity extended neutral humeral platform with first the +0 mm and then the +3 mm insert. With the +3 mm insert, the arm demonstrated excellent range of motion as the hand could be brought across the chest to the opposite shoulder and brought to the top of the patient's head and to the patient's ear. The shoulder appeared stable throughout this range of motion. The joint was dislocated and the trial components removed.  ? ?The permanent #13 Identity micro-stem was connected with the -6 mm Identity extended neutral humeral platform on the back table before this construct was impacted into place with care taken to maintain the appropriate version. The +3 mm insert was impacted into place. While doing so, one of the small plastic tabs band, precluding adequate locking of the insert. Therefore, this was discarded and a new +3 mm retentive insert was snapped into place. The shoulder was relocated using two finger pressure and again placed through a range of motion with the findings as described above. ? ?The wound was copiously irrigated with sterile saline solution using the jet lavage system before a total of 30 cc of 0.5% Sensorcaine with epinephrine was injected into the pericapsular and peri-incisional tissues to help with postoperative analgesia. The subscapularis tendon was reapproximated using #2 FiberWire interrupted sutures. The deltopectoral interval was closed using #0 Vicryl interrupted sutures before the subcutaneous tissues were closed using 2-0 Vicryl interrupted sutures. The skin was closed  using staples. Prior to closing the skin, 1 g of transexemic acid in 10 cc of normal saline was injected intra-articularly to help with postoperative bleeding. A sterile occlusive dressing was applied to the wound before the arm was placed into a shoulder immobilizer with an abduction pillow. A Polar Care system also was applied to the shoulder. The patient was then transferred back to a hospital bed before being awakened, extubated, and returned to the recovery room in satisfactory condition after tolerating the procedure well. ?

## 2021-11-19 NOTE — Anesthesia Postprocedure Evaluation (Signed)
Anesthesia Post Note ? ?Patient: Manuel Buck ? ?Procedure(s) Performed: Right reverse total shoulder arthroplasty with biceps tenodesis (Right: Shoulder) ?BICEPS TENODESIS (Right: Shoulder) ? ?Patient location during evaluation: PACU ?Anesthesia Type: General ?Level of consciousness: awake and alert ?Pain management: pain level controlled ?Vital Signs Assessment: post-procedure vital signs reviewed and stable ?Respiratory status: spontaneous breathing, nonlabored ventilation, respiratory function stable and patient connected to nasal cannula oxygen ?Cardiovascular status: blood pressure returned to baseline and stable ?Postop Assessment: no apparent nausea or vomiting ?Anesthetic complications: no ? ? ?No notable events documented. ? ? ?Last Vitals:  ?Vitals:  ? 11/19/21 1130 11/19/21 1142  ?BP: (!) 156/78 (!) 161/77  ?Pulse: 60 60  ?Resp: 13 16  ?Temp: (!) 36.1 ?C (!) 36.1 ?C  ?SpO2: 100% 95%  ?  ?Last Pain:  ?Vitals:  ? 11/19/21 1142  ?TempSrc: Temporal  ?PainSc: 0-No pain  ? ? ?  ?  ?  ?  ?  ?  ? ?Precious Haws Mieshia Pepitone ? ? ? ? ?

## 2021-11-19 NOTE — Anesthesia Procedure Notes (Signed)
Anesthesia Regional Block: Interscalene brachial plexus block  ? ?Pre-Anesthetic Checklist: , timeout performed,  Correct Patient, Correct Site, Correct Laterality,  Correct Procedure, Correct Position, site marked,  Risks and benefits discussed,  Surgical consent,  Pre-op evaluation,  At surgeon's request and post-op pain management ? ?Laterality: Upper and Right ? ?Prep: chloraprep     ?  ?Needles:  ?Injection technique: Single-shot ? ?Needle Type: Stimiplex   ? ? ?Needle Length: 9cm  ?Needle Gauge: 22  ? ? ? ?Additional Needles: ? ? ?Procedures:,,,, ultrasound used (permanent image in chart),,    ?Narrative:  ?Start time: 11/19/2021 7:11 AM ?End time: 11/19/2021 7:13 AM ?Injection made incrementally with aspirations every 5 mL. ? ?Performed by: Personally  ?Anesthesiologist: Navina Wohlers, Precious Haws, MD ? ?Additional Notes: ?Patient consented for risk and benefits of nerve block including but not limited to nerve damage, failed block, bleeding and infection.  Patient voiced understanding. ? ?Functioning IV was confirmed and monitors were applied.  Timeout done prior to procedure and prior to any sedation being given to the patient.  Patient confirmed procedure site prior to any sedation given to the patient.  A 67m 22ga Stimuplex needle was used. Sterile prep,hand hygiene and sterile gloves were used.  Minimal sedation used for procedure.  No paresthesia endorsed by patient during the procedure.  Negative aspiration and negative test dose prior to incremental administration of local anesthetic. The patient tolerated the procedure well with no immediate complications. ? ? ? ?

## 2021-11-19 NOTE — Anesthesia Procedure Notes (Signed)
Procedure Name: Intubation ?Date/Time: 11/19/2021 7:39 AM ?Performed by: Loletha Grayer, CRNA ?Pre-anesthesia Checklist: Patient identified, Patient being monitored, Timeout performed, Emergency Drugs available and Suction available ?Patient Re-evaluated:Patient Re-evaluated prior to induction ?Oxygen Delivery Method: Circle system utilized ?Preoxygenation: Pre-oxygenation with 100% oxygen ?Induction Type: IV induction ?Ventilation: Mask ventilation without difficulty ?Laryngoscope Size: McGraph and 4 ?Grade View: Grade I ?Tube type: Oral ?Tube size: 7.5 mm ?Number of attempts: 1 ?Airway Equipment and Method: Stylet ?Placement Confirmation: ETT inserted through vocal cords under direct vision, positive ETCO2 and breath sounds checked- equal and bilateral ?Secured at: 22 cm ?Tube secured with: Tape ?Dental Injury: Teeth and Oropharynx as per pre-operative assessment  ? ? ? ? ?

## 2021-11-19 NOTE — H&P (Signed)
History of Present Illness: ?Manuel Buck is a 71 y.o. male who presents today for his surgical history and physical for upcoming right reverse total shoulder arthroplasty. Surgery scheduled with Dr. Roland Rack on 11/19/2021. The patient denies any changes in his medical history since he was last evaluated. He denies any numbness or tingling to the right upper extremity. He denies any falls or trauma affecting the right shoulder since his last evaluation. He denies any significant pain at today's visit but still reports limited range of motion. Pain score today is a 0 out of 10. He denies any personal history of heart attack, stroke, asthma or COPD. No personal history of blood clots. ? ?Past Medical History: ? Diabetes mellitus type 2, uncomplicated (CMS-HCC)  ? Hyperlipidemia  ? Hypertension  ? ?Past Surgical History: ? FLEXIBLE SIGMOIDOSCOPY 10/17/2000  ? COLONOSCOPY 04/04/2013 (Dr. Kennis Carina @ [place] - Adenomatous Polyps: [facility] 03/2018; Recall ltr 01/04/2018)  ? COLONOSCOPY 09/20/2018 (Adenomatous Polyp: [facility] 09/2023)  ? HERNIA REPAIR  ? Right hand fracture  ? Right shoulder setting  ? TONSILLECTOMY  ? ?Past Family History: ? Lung cancer Father  ? Aneurysm Sister  ? Diabetes type II Brother  ? ?Medications: ? acetaminophen (TYLENOL) 500 MG tablet Take 500 mg by mouth every 8 (eight) hours as needed for Pain  ? aspirin 81 MG EC tablet Take 1 tablet (81 mg total) by mouth once daily.  ? cyanocobalamin (VITAMIN B12) 1000 MCG tablet Take 1,000 mcg by mouth once daily.  ? multivitamin tablet Take 1 tablet by mouth once daily.  ? olmesartan-hydrochlorothiazide (BENICAR HCT) 20-12.5 mg tablet Take 1 tablet by mouth once daily 90 tablet 3  ? ?Allergies: ? Statins-Hmg-Coa Reductase Inhibitors Rash  ? Sulfa (Sulfonamide Antibiotics) Unknown  ? ?Review of Systems:  ?A comprehensive 14 point ROS was performed, reviewed by me today, and the pertinent orthopaedic findings are documented in the HPI. ? ?Physical Exam: ?BP (!) 158/80  Ht  182.9 cm (6')  Wt 89.1 kg (196 lb 6.4 oz)  BMI 26.64 kg/m?  ?General/Constitutional: The patient appears to be well-nourished, well-developed, and in no acute distress. ?Neuro/Psych: Normal mood and affect, oriented to person, place and time. ?Eyes: Non-icteric. Pupils are equal, round, and reactive to light, and exhibit synchronous movement. ?ENT: Unremarkable. ?Lymphatic: No palpable adenopathy. ?Respiratory: Lungs clear to auscultation, Normal chest excursion, No wheezes and Non-labored breathing ?Cardiovascular: Regular rate and rhythm. No murmurs. and No edema, swelling or tenderness, except as noted in detailed exam. ?Integumentary: No impressive skin lesions present, except as noted in detailed exam. ?Musculoskeletal: Unremarkable, except as noted in detailed exam. ? ?Right shoulder exam: ?SKIN: normal ?SWELLING: none ?WARMTH: none ?LYMPH NODES: no adenopathy palpable ?CREPITUS: none ?TENDERNESS: Minimally tender along lateral acromion ?ROM (active):  ?Forward flexion: 60 degrees ?Abduction: 70 degrees ?Internal rotation: L2 ?ROM (passive):  ?Forward flexion: 160 degrees ?Abduction: 155 degrees  ?ER/IR at 90 abd: 80 degrees / 50 degrees ?  ?He notes minimal discomfort at the extremes of all motions. ?  ?STRENGTH: Forward flexion: 2/5 ?Abduction: 2/5 ?External rotation: 4/5 ?Internal rotation: 4-4+/5 ?Pain with RC testing: No pain with resisted internal or external rotation, but unable to assess pain with forward flexion or abduction as he is unable to do these actions. ?  ?STABILITY: Normal ?  ?SPECIAL TESTS: Luan Pulling' test: Minimally positive ?Speed's test: Not evaluated ?Capsulitis - pain w/ passive ER: no ?Crossed arm test: no ?Crank: Not evaluated ?Anterior apprehension: Negative ?Posterior apprehension: Not evaluated ?  ?He  is neurovascularly intact to the right upper extremity. ? ?MRI OF THE RIGHT SHOULDER WITHOUT CONTRAST:  ?Full-thickness, full width tear of the supraspinatus tendon with  ?retraction  of the glenoid. Grade 3 supraspinatus atrophy.  ? ?Full-thickness, near full width tear of the mid to anterior superior  ?fibers of the infraspinatus tendon with retraction to the glenoid.  ?There may be a few intact posteroinferior infraspinatus fibers.  ?Grade 4 infraspinatus atrophy.  ? ?High-grade/probable full-thickness tear of the mid to superior  ?fibers of subscapularis tendon with 1.0 cm retraction. Grade 4 mid  ?to superior subscapularis atrophy.  ? ?Moderate glenohumeral and acromioclavicular joint osteoarthritis.  ? ?Diffuse degenerative labral fraying and tearing, with focal  ?posteroinferior labral tear at the chondrolabral junction from  ?approximally 5:30-7:30.  ? ?Small joint effusion.  ? ?Impression: ?1. Rotator cuff arthropathy, right ?2. Nontraumatic complete tear of right rotator cuff ? ?Plan:  ?1. Treatment options were discussed today with the patient. ?2. The patient is scheduled for a right reverse total shoulder arthroplasty with Dr. Roland Rack on 11/19/2021. ?3. The patient was instructed on the risk and benefits of surgery, patient verbalizes his understanding and wishes to proceed. ?4. This document will serve as a surgical history and physical for the patient. ?5. The patient will follow-up per standard postop protocol. They can call the clinic they have any questions, new symptoms develop or symptoms worsen. ? ?The procedure was discussed with the patient, as were the potential risks (including bleeding, infection, nerve and/or blood vessel injury, persistent or recurrent pain, failure of the hardware, dislocation, stress fracture, need for further surgery, blood clots, strokes, heart attacks and/or arhythmias, pneumonia, etc.) and benefits. The patient states his understanding and wishes to proceed.  ? ? ?H&P reviewed and patient re-examined. No changes. ? ?

## 2021-11-19 NOTE — Discharge Instructions (Addendum)
Orthopedic discharge instructions: May shower with intact OpSite dressing once nerve block has worn off. Apply ice frequently to shoulder or use Polar Care device. Take ibuprofen 600-800 mg TID with meals for 5-7 days, then as necessary. Take oxycodone as prescribed when needed.  May supplement with ES Tylenol if necessary. Keep shoulder immobilizer on at all times except may remove for bathing purposes. Follow-up in 10-14 days or as scheduled.  AMBULATORY SURGERY  DISCHARGE INSTRUCTIONS   The drugs that you were given will stay in your system until tomorrow so for the next 24 hours you should not:  Drive an automobile Make any legal decisions Drink any alcoholic beverage   You may resume regular meals tomorrow.  Today it is better to start with liquids and gradually work up to solid foods.  You may eat anything you prefer, but it is better to start with liquids, then soup and crackers, and gradually work up to solid foods.   Please notify your doctor immediately if you have any unusual bleeding, trouble breathing, redness and pain at the surgery site, drainage, fever, or pain not relieved by medication.    Additional Instructions:     Please contact your physician with any problems or Same Day Surgery at [phone], Monday through Friday 6 am to 4 pm, or [place] at [facility] number at [phone].      Interscalene Nerve Block with Exparel   For your surgery you have received an Interscalene Nerve Block with Exparel. Nerve Blocks affect many types of nerves, including nerves that control movement, pain and normal sensation.  You may experience feelings such as numbness, tingling, heaviness, weakness or the inability to move your arm or the feeling or sensation that your arm has "fallen asleep". A nerve block with Exparel can last up to 5 days.  Usually the weakness wears off first.  The tingling and heaviness usually wear off next.  Finally you may start to notice  pain.  Keep in mind that this may occur in any order.  Once a nerve block starts to wear off it is usually completely gone within 60 minutes. ISNB may cause mild shortness of breath, a hoarse voice, blurry vision, unequal pupils, or drooping of the face on the same side as the nerve block.  These symptoms will usually resolve with the numbness.  Very rarely the procedure itself can cause mild seizures. If needed, your surgeon will give you a prescription for pain medication.  It will take about 60 minutes for the oral pain medication to become fully effective.  So, it is recommended that you start taking this medication before the nerve block first begins to wear off, or when you first begin to feel discomfort. Take your pain medication only as prescribed.  Pain medication can cause sedation and decrease your breathing if you take more than you need for the level of pain that you have. Nausea is a common side effect of many pain medications.  You may want to eat something before taking your pain medicine to prevent nausea. After an Interscalene nerve block, you cannot feel pain, pressure or extremes in temperature in the effected arm.  Because your arm is numb it is at an increased risk for injury.  To decrease the possibility of injury, please practice the following:  While you are awake change the position of your arm frequently to prevent too much pressure on any one area for prolonged periods of time.  If you have a cast  or tight dressing, check the color or your fingers every couple of hours.  Call your surgeon with the appearance of any discoloration (white or blue). If you are given a sling to wear before you go home, please wear it  at all times until the block has completely worn off.  Do not get up at night without your sling. Please contact [facility] or your surgeon if you do not begin to regain sensation after 7 days from the surgery.  Anesthesia may be contacted by calling the Same Day  Surgery Department, Mon. through Fri., 6 am to 4 pm at [phone].   If you experience any other problems or concerns, please contact your surgeon's office. If you experience severe or prolonged shortness of breath go to the nearest emergency department.   SHOULDER SLING IMMOBILIZER   VIDEO Slingshot 2 Shoulder Brace Application - YouTube ---[email or web address]  INSTRUCTIONS While supporting the injured arm, slide the forearm into the sling. Wrap the adjustable shoulder strap around the neck and shoulders and attach the strap end to the sling using  the alligator strap tab.  Adjust the shoulder strap to the required length. Position the shoulder pad behind the neck. To secure the shoulder pad location (optional), pull the shoulder strap away from the shoulder pad, unfold the hook material on the top of the pad, then press the shoulder strap back onto the hook material to secure the pad in place. Attach the closure strap across the open top of the sling. Position the strap so that it holds the arm securely in the sling. Next, attach the thumb strap to the open end of the sling between the thumb and fingers. After sling has been fit, it may be easily removed and reapplied using the quick release buckle on shoulder strap. If a neutral pillow or 15 abduction pillow is included, place the pillow at the waistline. Attach the sling to the pillow, lining up hook material on the pillow with the loop on sling. Adjust the waist strap to fit.  If waist strap is too long, cut it to fit. Use the small piece of double sided hook material (located on top of the pillow) to secure the strap end. Place the double sided hook material on the inside of the cut strap end and secure it to the waist strap.     If no pillow is included, attach the waist strap to the sling and adjust to fit.    Washing Instructions: Straps and sling must be removed and cleaned regularly depending on your  activity level and perspiration. Hand wash straps and sling in cold water with mild detergent, rinse, air dry      POLAR CARE INFORMATION  [email or web address]  How to use Rodeo?   YouTube   [email or web address]  OPERATING INSTRUCTIONS  Start the product With dry hands, connect the transformer to the electrical connection located on the top of the cooler. Next, plug the transformer into an appropriate electrical outlet. The unit will automatically start running at this point.  To stop the pump, disconnect electrical power.  Unplug to stop the product when not in use. Unplugging the Polar Care unit turns it off. Always unplug immediately after use. Never leave it plugged in while unattended. Remove pad.    FIRST ADD WATER TO FILL LINE, THEN ICE---Replace ice when existing ice is almost melted  1 Discuss Treatment with your Licensed Health Care Practitioner and Use Only as Prescribed 2 Apply  Insulation Barrier & Cold Therapy Pad 3 Check for Moisture 4 Inspect Skin Regularly  Tips and Trouble Shooting Usage Tips 1. Use cubed or chunked ice for optimal performance. 2. It is recommended to drain the Pad between uses. To drain the pad, hold the Pad upright with the hose pointed toward the ground. Depress the black plunger and allow water to drain out. 3. You may disconnect the Pad from the unit without removing the pad from the affected area by depressing the silver tabs on the hose coupling and gently pulling the hoses apart. The Pad and unit will seal itself and will not leak. Note: Some dripping during release is normal. 4. DO NOT RUN PUMP WITHOUT WATER! The pump in this unit is designed to run with water. Running the unit without water will cause permanent damage to the pump. 5. Unplug unit before removing lid.  TROUBLESHOOTING GUIDE Pump not running, Water not flowing to the pad, Pad is not getting cold 1. Make sure the  transformer is plugged into the wall outlet. 2. Confirm that the ice and water are filled to the indicated levels. 3. Make sure there are no kinks in the pad. 4. Gently pull on the blue tube to make sure the tube/pad junction is straight. 5. Remove the pad from the treatment site and ll it while the pad is lying at; then reapply. 6. Confirm that the pad couplings are securely attached to the unit. Listen for the double clicks (Figure 1) to confirm the pad couplings are securely attached.  Leaks    Note: Some condensation on the lines, controller, and pads is unavoidable, especially in warmer climates. 1. If using a Breg Polar Care Cold Therapy unit with a detachable Cold Therapy Pad, and a leak exists (other than condensation on the lines) disconnect the pad couplings. Make sure the silver tabs on the couplings are depressed before reconnecting the pad to the pump hose; then confirm both sides of the coupling are properly clicked in. 2. If the coupling continues to leak or a leak is detected in the pad itself, stop using it and call [facility] at [phone].  Cleaning After use, empty and dry the unit with a soft cloth. Warm water and mild detergent may be used occasionally to clean the pump and tubes.  WARNING: The Athelstan can be cold enough to cause serious injury, including full skin necrosis. Follow these Operating Instructions, and carefully read the Product Insert (see pouch on side of unit) and the Cold Therapy Pad Fitting Instructions (provided with each Cold Therapy Pad) prior to use.

## 2021-11-19 NOTE — Evaluation (Signed)
Occupational Therapy Evaluation Patient Details Name: DEMETRIA LIGHTSEY MRN: [id number] DOB: [birth year] Today's Date: 11/19/2021   History of Present Illness 71yo male s/p reverse R TSA with bicep tenodesis on 11/19/2021.   Clinical Impression   Patient was seen for an OT evaluation this date. Pt lives with his spouse in a 1 story home and as independent prior to surgery, only occasionally using SPC for longer community distances. Pt presents with impaired strength/ROM, pain, and sensation to RUE as well as new onset L hand sensory deficits (1st-3rd digits, MD notified). These impairments result in a decreased ability to perform self care tasks requiring mod assist for UB/LB dressing and bathing, mod indep with toileting using urinal, and max assist for application of polar care and sling/immobilizer. Pt/spouse instructed in polar care mgt, sling/immobilizer mgt, ROM exercises for RUE, RUE precautions and how to maintain during ADL/IADL, adaptive strategies for bathing/dressing/toileting/grooming, positioning and considerations for sleep, and home/routines modifications to maximize falls prevention, safety, and independence. Handout provided. Pt/spouse verbalized understanding. OT adjusted sling/immobilizer and polar care to improve comfort, optimize positioning, and to maximize skin integrity/safety. Pt /spouseverbalized understanding of all education/training provided. Pt will benefit from skilled OT services while hospitalized to address these limitations and improve independence in daily tasks. Follow up therapy as arranged by surgeon.    Recommendations for follow up therapy are one component of a multi-disciplinary discharge planning process, led by the attending physician.  Recommendations may be updated based on patient status, additional functional criteria and insurance authorization.   Follow Up Recommendations  Follow physician's recommendations for discharge plan and follow up therapies     Assistance Recommended at Discharge PRN  Patient can return home with the following A little help with walking and/or transfers;A little help with bathing/dressing/bathroom;Assistance with cooking/housework;Assist for transportation;Direct supervision/assist for medications management;Help with stairs or ramp for entrance    Functional Status Assessment  Patient has had a recent decline in their functional status and demonstrates the ability to make significant improvements in function in a reasonable and predictable amount of time.  Equipment Recommendations  None recommended by OT    Recommendations for Other Services       Precautions / Restrictions Precautions Precautions: Shoulder;Fall Shoulder Interventions: Shoulder abduction pillow;Shoulder sling/immobilizer;At all times;Off for dressing/bathing/exercises Precaution Booklet Issued: Yes (comment) Restrictions Weight Bearing Restrictions: Yes RUE Weight Bearing: Non weight bearing      Mobility Bed Mobility Overal bed mobility: Modified Independent                  Transfers Overall transfer level: Needs assistance Equipment used: None Transfers: Sit to/from Stand Sit to Stand: Supervision, Min guard           General transfer comment: increased time/effort to come to full standing      Balance Overall balance assessment: Needs assistance Sitting-balance support: Feet supported, No upper extremity supported Sitting balance-Leahy Scale: Good     Standing balance support: Single extremity supported, During functional activity, No upper extremity supported Standing balance-Leahy Scale: Fair Standing balance comment: SBA to CGA                           ADL either performed or assessed with clinical judgement   ADL Overall ADL's : Needs assistance/impaired  General ADL Comments: Pt required MOD A for LB ADL which spouse was able to  provide. MAX A for polar care and shoulder sling mgt, and SBA-CGA for ADL transfers without AE.     Vision         Perception     Praxis      Pertinent Vitals/Pain Pain Assessment Pain Assessment: No/denies pain     Hand Dominance Right   Extremity/Trunk Assessment Upper Extremity Assessment Upper Extremity Assessment: RUE deficits/detail;LUE deficits/detail RUE Deficits / Details: continued decr sensation to thumb but able to grasp with full AROM, grip grossly 4/5 RUE: Unable to fully assess due to immobilization RUE Sensation: decreased proprioception;decreased light touch RUE Coordination: decreased fine motor;decreased gross motor LUE Deficits / Details: pt endorses decreased sensation to 1st-3rd digits causing difficulty with grasping to pull up his pants himself, new since surgery on non-surgical UE; MD notified. LUE Sensation: decreased light touch;decreased proprioception LUE Coordination: decreased fine motor       Cervical / Trunk Assessment Cervical / Trunk Assessment: Normal   Communication Communication Communication: No difficulties   Cognition Arousal/Alertness: Awake/alert Behavior During Therapy: WFL for tasks assessed/performed Overall Cognitive Status: Within Functional Limits for tasks assessed                                       General Comments       Exercises Other Exercises Other Exercises: Pt/spouse educated in home/routines modifications, falls prevention, AE/DME for ADL, polar care mgt, and sling mgt; handout provided to support recall and carryover   Shoulder Instructions      Home Living Family/patient expects to be discharged to:: Private residence Living Arrangements: Spouse/significant other Available Help at Discharge: Family;Available 24 hours/day Type of Home: House Home Access: Stairs to enter [employer] of Steps: 3 Entrance Stairs-Rails: Left Home Layout: One level     Bathroom Shower/Tub:  Occupational psychologist: Handicapped height (has one of each)     Home Equipment: Conservation officer, nature (2 wheels);Shower seat;Cane - single point;BSC/3in1;Wheelchair - manual          Prior Functioning/Environment Prior Level of Function : Independent/Modified Independent;Driving             Mobility Comments: PRN SPC for longer community distances, otherwise independent          OT Problem List: Decreased strength;Decreased coordination;Impaired sensation;Decreased range of motion;Impaired balance (sitting and/or standing);Decreased knowledge of use of DME or AE;Impaired UE functional use;Decreased knowledge of precautions      OT Treatment/Interventions: Self-care/ADL training;Therapeutic exercise;Therapeutic activities;DME and/or AE instruction;Patient/family education;Balance training    OT Goals(Current goals can be found in the care plan section) Acute Rehab OT Goals Patient Stated Goal: go home and recover OT Goal Formulation: With patient/family Time For Goal Achievement: 12/03/21 Potential to Achieve Goals: Good ADL Goals Pt Will Perform Lower Body Dressing: with modified independence;sit to/from stand;with caregiver independent in assisting Additional ADL Goal #1: Pt will independently instruct family in polar care mgt Additional ADL Goal #2: Pt will independently instruct family in shoulder sling/immobilizer mgt  OT Frequency: Min 2X/week    Co-evaluation              AM-PAC OT "6 Clicks" Daily Activity     Outcome Measure Help from another person eating meals?: A Little Help from another person taking care of personal grooming?: A Little Help from another person  toileting, which includes using toliet, bedpan, or urinal?: A Little Help from another person bathing (including washing, rinsing, drying)?: A Little Help from another person to put on and taking off regular upper body clothing?: A Lot Help from another person to put on and taking off  regular lower body clothing?: A Lot 6 Click Score: 16   End of Session Nurse Communication: Mobility status (notified MD of sensation deficits in L hand)  Activity Tolerance: Patient tolerated treatment well Patient left: in bed;with call bell/phone within reach;with nursing/sitter in room;with family/visitor present;Other (comment) (shoulder sling and polar care in place)  OT Visit Diagnosis: Other abnormalities of gait and mobility (R26.89)                Time: 7588-3254 OT Time Calculation (min): 49 min Charges:  OT General Charges $OT Visit: 1 Visit OT Evaluation $OT Eval Moderate Complexity: 1 Mod OT Treatments $Self Care/Home Management : 38-52 mins  Ardeth Perfect., MPH, MS, OTR/L ascom [phone] 11/19/21, 2:51 PM

## 2021-11-19 NOTE — Anesthesia Preprocedure Evaluation (Signed)
Anesthesia Evaluation  ?Patient identified by MRN, date of birth, ID band ?Patient awake ? ? ? ?Reviewed: ?Allergy & Precautions, NPO status , Patient's Chart, lab work & pertinent test results ? ?Airway ?Mallampati: III ? ?TM Distance: >3 FB ?Neck ROM: full ? ? ? Dental ? ?(+) Missing ?  ?Pulmonary ?neg shortness of breath, former smoker,  ?  ?Pulmonary exam normal ? ? ? ? ? ? ? Cardiovascular ?Exercise Tolerance: Good ?hypertension, (-) angina+ Peripheral Vascular Disease  ?(-) Past MI Normal cardiovascular exam ? ? ?  ?Neuro/Psych ?negative neurological ROS ? negative psych ROS  ? GI/Hepatic ?negative GI ROS, Neg liver ROS, GERD  Controlled,  ?Endo/Other  ?diabetes, Type 2 ? Renal/GU ?  ? ?  ?Musculoskeletal ? ? Abdominal ?  ?Peds ? Hematology ?negative hematology ROS ?(+)   ?Anesthesia Other Findings ?Past Medical History: ?No date: Cancer [facility]) ?    Comment:  tongue cancer 2016 ?No date: Diabetes mellitus without complication ([place]) ?    Comment:  Diet Controlled ?No date: GERD (gastroesophageal reflux disease) ?No date: Hypertension ?No date: Skipped heart beats ?    Comment:  occassionally-pt asymptomatic 01-21-2015) ? ?Past Surgical History: ?No date: ANKLE FRACTURE SURGERY; Left ?09/20/2018: COLONOSCOPY WITH PROPOFOL; N/A ?    Comment:  Procedure: COLONOSCOPY WITH PROPOFOL;  Surgeon: Vira Agar, ?             Gavin Pound, MD;  Location: [facility] ENDOSCOPY;  Service:  ?             Endoscopy;  Laterality: N/A; ?01/22/2015: DIRECT LARYNGOSCOPY; N/A ?    Comment:  Procedure: DIRECT LARYNGOSCOPY/LARYNGOSCOPY WITH BIOPSY; ?             Surgeon: Clyde Canterbury, MD;  Location: [facility] ORS;  Service: ?             ENT;  Laterality: N/A; ?03/19/2021: KYPHOPLASTY; N/A ?    Comment:  Procedure: T 12  KYPHOPLASTY;  Surgeon: Hessie Knows,  ?             MD;  Location: [facility] ORS;  Service: Orthopedics;   ?             Laterality: N/A; ?08/01/2019: LEFT HEART CATH AND CORONARY ANGIOGRAPHY; Left ?     Comment:  Procedure: LEFT HEART CATH AND CORONARY ANGIOGRAPHY;   ?             Surgeon: Teodoro Spray, MD;  Location: [facility] INVASIVE CV ?             LAB;  Service: Cardiovascular;  Laterality: Left; ?No date: NASAL ENDOSCOPY ?No date: SHOULDER ARTHROSCOPY; Right ?No date: SKIN GRAFT ?    Comment:  right hand ?No date: TONSILLECTOMY ? ?BMI   ? Body Mass Index: 25.77 kg/m?  ?  ? ? Reproductive/Obstetrics ?negative OB ROS ? ?  ? ? ? ? ? ? ? ? ? ? ? ? ? ?  ?  ? ? ? ? ? ? ? ? ?Anesthesia Physical ?Anesthesia Plan ? ?ASA: 3 ? ?Anesthesia Plan: General ETT  ? ?Post-op Pain Management: Regional block*  ? ?Induction: Intravenous ? ?PONV Risk Score and Plan: Ondansetron, Dexamethasone, Midazolam and Treatment may vary due to age or medical condition ? ?Airway Management Planned: Oral ETT ? ?Additional Equipment:  ? ?Intra-op Plan:  ? ?Post-operative Plan: Extubation in OR ? ?Informed Consent: I have reviewed the patients History and Physical, chart, labs and discussed the procedure including the risks, benefits  and alternatives for the proposed anesthesia with the patient or authorized representative who has indicated his/her understanding and acceptance.  ? ? ? ?Dental Advisory Given ? ?Plan Discussed with: Anesthesiologist, CRNA and Surgeon ? ?Anesthesia Plan Comments: (Patient consented for risks of anesthesia including but not limited to:  ?- adverse reactions to medications ?- damage to eyes, teeth, lips or other oral mucosa ?- nerve damage due to positioning  ?- sore throat or hoarseness ?- Damage to heart, brain, nerves, lungs, other parts of body or loss of life ? ?Patient voiced understanding.)  ? ? ? ? ? ? ?Anesthesia Quick Evaluation ? ?

## 2021-11-19 NOTE — Transfer of Care (Signed)
Immediate Anesthesia Transfer of Care Note ? ?Patient: Manuel Buck ? ?Procedure(s) Performed: Right reverse total shoulder arthroplasty with biceps tenodesis (Right: Shoulder) ?BICEPS TENODESIS (Right: Shoulder) ? ?Patient Location: PACU ? ?Anesthesia Type:GA combined with regional for post-op pain ? ?Level of Consciousness: awake ? ?Airway & Oxygen Therapy: Patient Spontanous Breathing and Patient connected to face mask oxygen ? ?Post-op Assessment: Report given to RN and Post -op Vital signs reviewed and stable ? ?Post vital signs: Reviewed and stable ? ?Last Vitals:  ?Vitals Value Taken Time  ?BP 166/75 11/19/21 1031  ?Temp 36.2 ?C 11/19/21 1030  ?Pulse 66 11/19/21 1040  ?Resp 11 11/19/21 1040  ?SpO2 100 % 11/19/21 1040  ?Vitals shown include unvalidated device data. ? ?Last Pain:  ?Vitals:  ? 11/19/21 1030  ?TempSrc:   ?PainSc: Asleep  ?   ? ?  ? ?Complications: No notable events documented. ?

## 2021-11-20 LAB — SURGICAL PATHOLOGY

## 2021-11-23 ENCOUNTER — Encounter: Payer: Self-pay | Admitting: Surgery

## 2021-11-25 DIAGNOSIS — M25511 Pain in right shoulder: Secondary | ICD-10-CM | POA: Diagnosis not present

## 2021-11-25 DIAGNOSIS — Z96611 Presence of right artificial shoulder joint: Secondary | ICD-10-CM | POA: Diagnosis not present

## 2021-11-25 DIAGNOSIS — M25611 Stiffness of right shoulder, not elsewhere classified: Secondary | ICD-10-CM | POA: Diagnosis not present

## 2021-11-25 DIAGNOSIS — R29898 Other symptoms and signs involving the musculoskeletal system: Secondary | ICD-10-CM | POA: Diagnosis not present

## 2021-12-08 DIAGNOSIS — M25511 Pain in right shoulder: Secondary | ICD-10-CM | POA: Diagnosis not present

## 2021-12-08 DIAGNOSIS — Z96611 Presence of right artificial shoulder joint: Secondary | ICD-10-CM | POA: Diagnosis not present

## 2021-12-14 DIAGNOSIS — Z96611 Presence of right artificial shoulder joint: Secondary | ICD-10-CM | POA: Diagnosis not present

## 2021-12-14 DIAGNOSIS — M25511 Pain in right shoulder: Secondary | ICD-10-CM | POA: Diagnosis not present

## 2021-12-21 DIAGNOSIS — T466X5A Adverse effect of antihyperlipidemic and antiarteriosclerotic drugs, initial encounter: Secondary | ICD-10-CM | POA: Diagnosis not present

## 2021-12-21 DIAGNOSIS — I10 Essential (primary) hypertension: Secondary | ICD-10-CM | POA: Diagnosis not present

## 2021-12-21 DIAGNOSIS — Z96611 Presence of right artificial shoulder joint: Secondary | ICD-10-CM | POA: Diagnosis not present

## 2021-12-21 DIAGNOSIS — M791 Myalgia, unspecified site: Secondary | ICD-10-CM | POA: Diagnosis not present

## 2021-12-21 DIAGNOSIS — I739 Peripheral vascular disease, unspecified: Secondary | ICD-10-CM | POA: Diagnosis not present

## 2021-12-21 DIAGNOSIS — M25511 Pain in right shoulder: Secondary | ICD-10-CM | POA: Diagnosis not present

## 2021-12-21 DIAGNOSIS — E782 Mixed hyperlipidemia: Secondary | ICD-10-CM | POA: Diagnosis not present

## 2021-12-21 DIAGNOSIS — R5383 Other fatigue: Secondary | ICD-10-CM | POA: Diagnosis not present

## 2021-12-21 DIAGNOSIS — E1169 Type 2 diabetes mellitus with other specified complication: Secondary | ICD-10-CM | POA: Diagnosis not present

## 2021-12-21 DIAGNOSIS — E538 Deficiency of other specified B group vitamins: Secondary | ICD-10-CM | POA: Diagnosis not present

## 2021-12-28 DIAGNOSIS — M25511 Pain in right shoulder: Secondary | ICD-10-CM | POA: Diagnosis not present

## 2021-12-28 DIAGNOSIS — Z96611 Presence of right artificial shoulder joint: Secondary | ICD-10-CM | POA: Diagnosis not present

## 2022-01-01 DIAGNOSIS — Z96611 Presence of right artificial shoulder joint: Secondary | ICD-10-CM | POA: Diagnosis not present

## 2022-01-04 DIAGNOSIS — M25511 Pain in right shoulder: Secondary | ICD-10-CM | POA: Diagnosis not present

## 2022-01-04 DIAGNOSIS — Z96611 Presence of right artificial shoulder joint: Secondary | ICD-10-CM | POA: Diagnosis not present

## 2022-01-06 DIAGNOSIS — Z96611 Presence of right artificial shoulder joint: Secondary | ICD-10-CM | POA: Diagnosis not present

## 2022-01-12 DIAGNOSIS — M25511 Pain in right shoulder: Secondary | ICD-10-CM | POA: Diagnosis not present

## 2022-01-12 DIAGNOSIS — Z96611 Presence of right artificial shoulder joint: Secondary | ICD-10-CM | POA: Diagnosis not present

## 2022-01-14 DIAGNOSIS — M25511 Pain in right shoulder: Secondary | ICD-10-CM | POA: Diagnosis not present

## 2022-01-14 DIAGNOSIS — Z96611 Presence of right artificial shoulder joint: Secondary | ICD-10-CM | POA: Diagnosis not present

## 2022-01-19 DIAGNOSIS — Z96611 Presence of right artificial shoulder joint: Secondary | ICD-10-CM | POA: Diagnosis not present

## 2022-01-19 DIAGNOSIS — M25511 Pain in right shoulder: Secondary | ICD-10-CM | POA: Diagnosis not present

## 2022-01-21 DIAGNOSIS — Z96611 Presence of right artificial shoulder joint: Secondary | ICD-10-CM | POA: Diagnosis not present

## 2022-01-21 DIAGNOSIS — M25511 Pain in right shoulder: Secondary | ICD-10-CM | POA: Diagnosis not present

## 2022-01-25 DIAGNOSIS — H353122 Nonexudative age-related macular degeneration, left eye, intermediate dry stage: Secondary | ICD-10-CM | POA: Diagnosis not present

## 2022-01-25 DIAGNOSIS — E119 Type 2 diabetes mellitus without complications: Secondary | ICD-10-CM | POA: Diagnosis not present

## 2022-01-25 DIAGNOSIS — M25511 Pain in right shoulder: Secondary | ICD-10-CM | POA: Diagnosis not present

## 2022-01-25 DIAGNOSIS — Z96611 Presence of right artificial shoulder joint: Secondary | ICD-10-CM | POA: Diagnosis not present

## 2022-01-28 DIAGNOSIS — Z96611 Presence of right artificial shoulder joint: Secondary | ICD-10-CM | POA: Diagnosis not present

## 2022-01-28 DIAGNOSIS — M25511 Pain in right shoulder: Secondary | ICD-10-CM | POA: Diagnosis not present

## 2022-02-02 DIAGNOSIS — Z96611 Presence of right artificial shoulder joint: Secondary | ICD-10-CM | POA: Diagnosis not present

## 2022-02-02 DIAGNOSIS — M25511 Pain in right shoulder: Secondary | ICD-10-CM | POA: Diagnosis not present

## 2022-02-04 DIAGNOSIS — Z96611 Presence of right artificial shoulder joint: Secondary | ICD-10-CM | POA: Diagnosis not present

## 2022-02-04 DIAGNOSIS — M25511 Pain in right shoulder: Secondary | ICD-10-CM | POA: Diagnosis not present

## 2022-02-05 DIAGNOSIS — M545 Low back pain, unspecified: Secondary | ICD-10-CM | POA: Diagnosis not present

## 2022-02-05 DIAGNOSIS — M47816 Spondylosis without myelopathy or radiculopathy, lumbar region: Secondary | ICD-10-CM | POA: Diagnosis not present

## 2022-02-05 DIAGNOSIS — M4185 Other forms of scoliosis, thoracolumbar region: Secondary | ICD-10-CM | POA: Diagnosis not present

## 2022-02-05 DIAGNOSIS — G8929 Other chronic pain: Secondary | ICD-10-CM | POA: Diagnosis not present

## 2022-02-09 DIAGNOSIS — M545 Low back pain, unspecified: Secondary | ICD-10-CM | POA: Diagnosis not present

## 2022-02-09 DIAGNOSIS — S32010A Wedge compression fracture of first lumbar vertebra, initial encounter for closed fracture: Secondary | ICD-10-CM | POA: Diagnosis not present

## 2022-02-09 DIAGNOSIS — W11XXXA Fall on and from ladder, initial encounter: Secondary | ICD-10-CM | POA: Diagnosis not present

## 2022-02-09 DIAGNOSIS — S60511A Abrasion of right hand, initial encounter: Secondary | ICD-10-CM | POA: Diagnosis not present

## 2022-02-09 DIAGNOSIS — S50811A Abrasion of right forearm, initial encounter: Secondary | ICD-10-CM | POA: Diagnosis not present

## 2022-02-15 DIAGNOSIS — Z96611 Presence of right artificial shoulder joint: Secondary | ICD-10-CM | POA: Diagnosis not present

## 2022-02-15 DIAGNOSIS — M545 Low back pain, unspecified: Secondary | ICD-10-CM | POA: Diagnosis not present

## 2022-02-15 DIAGNOSIS — S32010A Wedge compression fracture of first lumbar vertebra, initial encounter for closed fracture: Secondary | ICD-10-CM | POA: Diagnosis not present

## 2022-02-17 ENCOUNTER — Other Ambulatory Visit: Payer: Self-pay | Admitting: Student

## 2022-02-17 DIAGNOSIS — M545 Low back pain, unspecified: Secondary | ICD-10-CM

## 2022-02-17 DIAGNOSIS — S32010A Wedge compression fracture of first lumbar vertebra, initial encounter for closed fracture: Secondary | ICD-10-CM

## 2022-02-18 DIAGNOSIS — Z96611 Presence of right artificial shoulder joint: Secondary | ICD-10-CM | POA: Diagnosis not present

## 2022-02-18 DIAGNOSIS — M25511 Pain in right shoulder: Secondary | ICD-10-CM | POA: Diagnosis not present

## 2022-02-19 ENCOUNTER — Ambulatory Visit
Admission: RE | Admit: 2022-02-19 | Discharge: 2022-02-19 | Disposition: A | Discharge status: Home / Self Care | Payer: PPO | Source: Ambulatory Visit | Attending: Student | Admitting: Student

## 2022-02-19 ENCOUNTER — Other Ambulatory Visit (HOSPITAL_BASED_OUTPATIENT_CLINIC_OR_DEPARTMENT_OTHER): Payer: PPO

## 2022-02-19 DIAGNOSIS — M545 Low back pain, unspecified: Secondary | ICD-10-CM | POA: Insufficient documentation

## 2022-02-19 DIAGNOSIS — S32020A Wedge compression fracture of second lumbar vertebra, initial encounter for closed fracture: Secondary | ICD-10-CM | POA: Diagnosis not present

## 2022-02-19 DIAGNOSIS — S32019A Unspecified fracture of first lumbar vertebra, initial encounter for closed fracture: Secondary | ICD-10-CM | POA: Diagnosis not present

## 2022-02-19 DIAGNOSIS — S32010A Wedge compression fracture of first lumbar vertebra, initial encounter for closed fracture: Secondary | ICD-10-CM | POA: Insufficient documentation

## 2022-02-22 ENCOUNTER — Ambulatory Visit: Payer: PPO

## 2022-02-22 DIAGNOSIS — Z96611 Presence of right artificial shoulder joint: Secondary | ICD-10-CM | POA: Diagnosis not present

## 2022-02-23 LAB — SURGICAL PATHOLOGY

## 2022-03-01 ENCOUNTER — Other Ambulatory Visit: Payer: Self-pay | Admitting: Student

## 2022-03-01 DIAGNOSIS — M25511 Pain in right shoulder: Secondary | ICD-10-CM | POA: Diagnosis not present

## 2022-03-01 DIAGNOSIS — S32020S Wedge compression fracture of second lumbar vertebra, sequela: Secondary | ICD-10-CM

## 2022-03-01 DIAGNOSIS — S32010S Wedge compression fracture of first lumbar vertebra, sequela: Secondary | ICD-10-CM

## 2022-03-01 DIAGNOSIS — Z96611 Presence of right artificial shoulder joint: Secondary | ICD-10-CM | POA: Diagnosis not present

## 2022-03-02 ENCOUNTER — Other Ambulatory Visit: Payer: Self-pay | Admitting: *Deleted

## 2022-03-02 ENCOUNTER — Ambulatory Visit
Admission: RE | Admit: 2022-03-02 | Discharge: 2022-03-02 | Disposition: A | Discharge status: Home / Self Care | Payer: PPO | Source: Ambulatory Visit | Attending: Student | Admitting: Student

## 2022-03-02 ENCOUNTER — Other Ambulatory Visit: Payer: Self-pay | Admitting: Student

## 2022-03-02 DIAGNOSIS — S32010S Wedge compression fracture of first lumbar vertebra, sequela: Secondary | ICD-10-CM

## 2022-03-02 DIAGNOSIS — S32020A Wedge compression fracture of second lumbar vertebra, initial encounter for closed fracture: Secondary | ICD-10-CM | POA: Diagnosis not present

## 2022-03-02 DIAGNOSIS — S32000A Wedge compression fracture of unspecified lumbar vertebra, initial encounter for closed fracture: Secondary | ICD-10-CM

## 2022-03-02 DIAGNOSIS — S32020S Wedge compression fracture of second lumbar vertebra, sequela: Secondary | ICD-10-CM

## 2022-03-02 DIAGNOSIS — S32010A Wedge compression fracture of first lumbar vertebra, initial encounter for closed fracture: Secondary | ICD-10-CM | POA: Diagnosis not present

## 2022-03-02 DIAGNOSIS — M545 Low back pain, unspecified: Secondary | ICD-10-CM

## 2022-03-02 DIAGNOSIS — N289 Disorder of kidney and ureter, unspecified: Secondary | ICD-10-CM | POA: Diagnosis not present

## 2022-03-02 DIAGNOSIS — S3210XA Unspecified fracture of sacrum, initial encounter for closed fracture: Secondary | ICD-10-CM | POA: Diagnosis not present

## 2022-03-02 NOTE — H&P (Signed)
Reason for visit: Patient was seen in consultation today for symptomatic spinal fracture evaluation for kyphoplasty at the request of [place] [facility] Team(s): PCP; Kirk Ruths, MD Orthopedics ;  Lattie Corns, [place] (Spine) and Poggi, Marshall Cork, MD   History of Present Illness:  Manuel Buck is a 71 y/o M w PMHx significant for tongue CA (2016), T2DM, HTN and prior spine fracture. Pt had T12 fracture and noted chronic L1 fracture, and was treated w T12 KP by Dr. Rudene Christians on 03/19/21. He presents to day in consultation for back pain and evaluation for KP and sacroplasty. Pt reports that he fell off a ladder on 02/09/22, approximately 3 ft up, and landed on his buttocks. He felt immediate pain and also some paresthesias down his L leg which passed after a week. His back and bottom pain persisted and he was seen at and Orthopedic office who obtained a MR L spine revealing acute L1, L2 and partially visualized sacral fractures. He has been wearing a brace that has helped with discomfort, though still suffers from buttock pain. He is joined today by his wife, Manuel Buck.  He rates his pain as 2/10 at his sacrum and 1/10 at his lower back despite taking pain medications. His pain is present most of the time. Due to his persistent pain he was referred to Vascular and Interventional Radiology to evaluate for potential minimally invasive treatment with kyphoplasty / sacroplasty.  Review of Systems: A 12 point ROS discussed and pertinent positives are indicated in the HPI above.  All other systems are negative.  Past Medical History:  Diagnosis Date   Cancer ([place])    tongue cancer 2016   Diabetes mellitus without complication (HCC)    Diet Controlled   GERD (gastroesophageal reflux disease)    Hypertension    Skipped heart beats    occassionally-pt asymptomatic 01-21-2015)    Past Surgical History:  Procedure Laterality Date   ANKLE FRACTURE SURGERY Left    BICEPT TENODESIS Right  11/19/2021   Procedure: BICEPS TENODESIS;  Surgeon: Corky Mull, MD;  Location: [facility] ORS;  Service: Orthopedics;  Laterality: Right;   COLONOSCOPY WITH PROPOFOL N/A 09/20/2018   Procedure: COLONOSCOPY WITH PROPOFOL;  Surgeon: Manya Silvas, MD;  Location: [facility];  Service: Endoscopy;  Laterality: N/A;   DIRECT LARYNGOSCOPY N/A 01/22/2015   Procedure: DIRECT LARYNGOSCOPY/LARYNGOSCOPY WITH BIOPSY;  Surgeon: Clyde Canterbury, MD;  Location: [facility] ORS;  Service: ENT;  Laterality: N/A;   KYPHOPLASTY N/A 03/19/2021   Procedure: T 12  KYPHOPLASTY;  Surgeon: Hessie Knows, MD;  Location: [facility] ORS;  Service: Orthopedics;  Laterality: N/A;   LEFT HEART CATH AND CORONARY ANGIOGRAPHY Left 08/01/2019   Procedure: LEFT HEART CATH AND CORONARY ANGIOGRAPHY;  Surgeon: Teodoro Spray, MD;  Location: [facility];  Service: Cardiovascular;  Laterality: Left;   NASAL ENDOSCOPY     REVERSE SHOULDER ARTHROPLASTY Right 11/19/2021   Procedure: Right reverse total shoulder arthroplasty with biceps tenodesis;  Surgeon: Corky Mull, MD;  Location: [facility] ORS;  Service: Orthopedics;  Laterality: Right;   SHOULDER ARTHROSCOPY Right    SKIN GRAFT     right hand   TONSILLECTOMY      Allergies: Statins and Sulfa antibiotics  Medications: Prior to Admission medications   Medication Sig Start Date End Date Taking? Authorizing Provider  acetaminophen (TYLENOL) 500 MG tablet Take 500 mg by mouth every 6 (six) hours as needed for mild pain or moderate pain.  Yes [provider]  aspirin EC 81 MG tablet Take 81 mg by mouth daily.    Yes [provider]  Multiple Vitamin (MULTIVITAMIN WITH MINERALS) TABS tablet Take 1 tablet by mouth in the morning.   Yes [provider]  olmesartan-hydrochlorothiazide (BENICAR HCT) 20-12.5 MG tablet Take 1 tablet by mouth daily. 06/09/18  Yes [provider]  omeprazole (PRILOSEC) 20 MG capsule Take 20 mg by mouth daily as needed.   Yes  [provider]  vitamin B-12 (CYANOCOBALAMIN) 500 MCG tablet Take 500 mcg by mouth in the morning.   Yes [provider]  oxyCODONE (ROXICODONE) 5 MG immediate release tablet Take 1-2 tablets (5-10 mg total) by mouth every 4 (four) hours as needed for moderate pain or severe pain. Patient not taking: Reported on 03/02/2022 11/19/21   Poggi, Marshall Cork, MD     No family history on file.  Social History   Socioeconomic History   Marital status: Married    Spouse name: Not on file   Number of children: Not on file   Years of education: Not on file   Highest education level: Not on file  Occupational History   Not on file  Tobacco Use   Smoking status: Former    Types: Cigarettes    Quit date: 01/20/1994    Years since quitting: 28.1   Smokeless tobacco: Never  Vaping Use   Vaping Use: Never used  Substance and Sexual Activity   Alcohol use: No   Drug use: No   Sexual activity: Not on file  Other Topics Concern   Not on file  Social History Narrative   Live in private residence with wife, Manuel Buck    Social Determinants of Health   Financial Resource Strain: Not on file  Food Insecurity: Not on file  Transportation Needs: Not on file  Physical Activity: Not on file  Stress: Not on file  Social Connections: Not on file    Review of Systems  As above  Vital Signs: BP (!) 171/78 (BP Location: Right Arm, Patient Position: Sitting, Cuff Size: Normal)   Pulse (!) 59   Temp 97.8 F (36.6 C) (Oral)   Resp 16   Wt 89.8 kg   SpO2 96%   BMI 26.85 kg/m   Physical Exam  General: WN, NAD  CV: RRR on monitor Pulm: normal work of breathing on RA Abd: S, ND, NT MSK: TLSO brace. *Point tenderness at mid to lower back*. Mild BLE edema Psych: Appropriate affect.   Imaging:  MR L spine, 02/26/22 Independently reviewed, demonstrating acute fractures at L1 and L2, with additional incompletely visualized sacral fractures     MR LUMBAR SPINE WO  CONTRAST  Result Date: 02/26/2022 CLINICAL DATA:  Fall.  Low back pain. EXAM: MRI LUMBAR SPINE WITHOUT CONTRAST TECHNIQUE: Multiplanar, multisequence MR imaging of the lumbar spine was performed. No intravenous contrast was administered. COMPARISON:  Radiographs July 7 22. FINDINGS: Segmentation:  Standard segmentation is assumed. Alignment:  No substantial sagittal subluxation. Vertebrae: Status post vertebral augmentation at T12 with mild surrounding marrow edema. There also is marrow edema within the L1 and L2 vertebral bodies, compatible with acute/recent fractures. Approximately 10% height loss of L1. no significant bony retropulsion. The partially imaged edema within the sacrum, suspicious for a sacral insufficiency fractures. Conus medullaris and cauda equina: Conus extends to the T12 level. Conus appears normal. Paraspinal and other soft tissues: Right renal cyst. Disc levels: T12-L1: Endplate spurring with mild left foraminal  stenosis. No significant canal right foraminal stenosis. L1-L2: Mild broad disc bulge with ligamentum flavum thickening facet arthropathy. Mild bilateral foraminal stenosis and mild canal stenosis. L2-L3: Broad disc bulge with ligamentum flavum thickening and by lateral facet arthropathy. Resulting moderate to severe canal stenosis at L2-L3. Mild bilateral foraminal stenosis. L3-L4: Broad disc bulge with superimposed small central disc protrusion with cranial extension. Mild canal stenosis and mild bilateral foraminal stenosis. L4-L5: Broad disc bulge with bilateral facet arthropathy and ligamentum flavum thickening. Resulting moderate right and mild-to-moderate left foraminal stenosis with mild to moderate bilateral subarticular recess and canal stenosis. L5-S1: Broad disc bulge. Bilateral facet arthropathy. Resulting moderate bilateral foraminal stenosis. Other: Dictation of the final report was delayed due to a technical issue in PACS. IMPRESSION: 1. Acute/recent L1 and L2  vertebral body fractures with associated marrow edema and approximately 10% L1 height loss. 2. Interval vertebroplasty of T12 with mild surrounding marrow edema, which could represent postoperative change and/or unhealed fracture. 3. Partially imaged edema within the sacrum, suspicious for a sacral insufficiency fractures. MRI of the pelvis could further evaluate if clinically warranted. 4. At L2-L3, moderate to severe canal stenosis. 5. At L4-L5, moderate right and mild-to-moderate left foraminal stenosis with moderate bilateral subarticular recess and canal stenosis. 6. At L5-S1, moderate bilateral foraminal stenosis. These results will be called to the ordering clinician or representative by the Radiologist Assistant, and communication documented in the PACS or [employer]. Electronically Signed   By: Margaretha Sheffield M.D.   On: 02/26/2022 12:48    Labs:  CBC: Recent Labs    11/05/21 1209  WBC 4.2  HGB 13.8  HCT 40.9  PLT 310    COAGS: No results for input(s): "INR", "APTT" in the last 8760 hours.  BMP: Recent Labs    11/05/21 1209  NA 136  K 4.0  CL 98  CO2 31  GLUCOSE 113*  BUN 35*  CALCIUM 9.5  CREATININE 1.22  GFRNONAA >60    Assessment and Plan:  71 y/o M w PMHx significant for tongue CA (2016) and prior T12 fracture s/p KP who presents with new and symptomatic L1 ([place]), L2 and sacral fractures.  The Pt is interested in pursuing a minimally-invasive option for therapy of symptomatic spinal fracture at this time with kyphoplasty.   Risks were discussed including, but not limited to, bleeding, infection, cement migration which may cause spinal cord damage, paralysis, pulmonary embolism or even death.   The procedure has been fully reviewed with the patient/patient's authorized representative. The risks, benefits and alternatives have been explained, and the patient/patient's authorized representative has consented to the procedure.  *MR L-spine reviewed.   *Additional imaging with MR Pelvis requested to evaluate incompletely imaged sacral FXs. *will stage L1+L2 kyphoplasty then sacroplasty on a different day, to avoid potential for cement toxicity. *CBC / INR to be drawn today. *ASA hold, 3 days *Procedures to be performed at [facility]. Proceed to schedule based on availability. *Will request Industry Representative presence ([employer] KP) *Ancef for pre op Abx. *recommend follow up with PCP for osteoporosis testing with DEXA given multiple fractures and age >86, per CMS PQRS reporting requirements.   Thank you for this interesting consult.  I greatly enjoyed meeting TAHJAY BINION and we look forward to participating in their care.  A copy of this report was sent to the requesting provider on this date.  Electronically Signed:  Michaelle Birks, MD [facility]    Pager. [phone]  Clinic. [phone]  I spent a total of  60 Minutes  in face to face in clinical consultation, greater than 50% of which was counseling/coordinating care for evaluation and treatment for spinal fractures.

## 2022-03-03 ENCOUNTER — Other Ambulatory Visit: Payer: Self-pay | Admitting: Student

## 2022-03-03 DIAGNOSIS — M8448XS Pathological fracture, other site, sequela: Secondary | ICD-10-CM

## 2022-03-04 ENCOUNTER — Other Ambulatory Visit: Payer: PPO

## 2022-03-04 ENCOUNTER — Ambulatory Visit
Admission: RE | Admit: 2022-03-04 | Discharge: 2022-03-04 | Disposition: A | Discharge status: Home / Self Care | Payer: PPO | Source: Ambulatory Visit | Attending: Student | Admitting: Student

## 2022-03-04 ENCOUNTER — Inpatient Hospital Stay: Admission: RE | Admit: 2022-03-04 | Payer: PPO | Source: Ambulatory Visit

## 2022-03-04 DIAGNOSIS — M25511 Pain in right shoulder: Secondary | ICD-10-CM | POA: Diagnosis not present

## 2022-03-04 DIAGNOSIS — M8448XS Pathological fracture, other site, sequela: Secondary | ICD-10-CM | POA: Diagnosis not present

## 2022-03-04 DIAGNOSIS — S32110A Nondisplaced Zone I fracture of sacrum, initial encounter for closed fracture: Secondary | ICD-10-CM | POA: Diagnosis not present

## 2022-03-04 DIAGNOSIS — Z96611 Presence of right artificial shoulder joint: Secondary | ICD-10-CM | POA: Diagnosis not present

## 2022-03-04 DIAGNOSIS — K4090 Unilateral inguinal hernia, without obstruction or gangrene, not specified as recurrent: Secondary | ICD-10-CM | POA: Diagnosis not present

## 2022-03-04 DIAGNOSIS — S32121A Minimally displaced Zone II fracture of sacrum, initial encounter for closed fracture: Secondary | ICD-10-CM | POA: Diagnosis not present

## 2022-03-04 DIAGNOSIS — N3289 Other specified disorders of bladder: Secondary | ICD-10-CM | POA: Diagnosis not present

## 2022-03-08 DIAGNOSIS — M25511 Pain in right shoulder: Secondary | ICD-10-CM | POA: Diagnosis not present

## 2022-03-08 DIAGNOSIS — Z96611 Presence of right artificial shoulder joint: Secondary | ICD-10-CM | POA: Diagnosis not present

## 2022-03-08 DIAGNOSIS — M12811 Other specific arthropathies, not elsewhere classified, right shoulder: Secondary | ICD-10-CM | POA: Diagnosis not present

## 2022-03-09 ENCOUNTER — Other Ambulatory Visit: Payer: Self-pay | Admitting: Student

## 2022-03-09 ENCOUNTER — Ambulatory Visit
Admission: RE | Admit: 2022-03-09 | Discharge: 2022-03-09 | Disposition: A | Discharge status: Home / Self Care | Payer: PPO | Source: Ambulatory Visit | Attending: Student | Admitting: Student

## 2022-03-09 DIAGNOSIS — S32020S Wedge compression fracture of second lumbar vertebra, sequela: Secondary | ICD-10-CM

## 2022-03-09 DIAGNOSIS — M8008XA Age-related osteoporosis with current pathological fracture, vertebra(e), initial encounter for fracture: Secondary | ICD-10-CM | POA: Diagnosis not present

## 2022-03-09 DIAGNOSIS — S32010S Wedge compression fracture of first lumbar vertebra, sequela: Secondary | ICD-10-CM

## 2022-03-09 HISTORY — PX: IR KYPHO LUMBAR INC FX REDUCE BONE BX UNI/BIL CANNULATION INC/IMAGING: IMG5519

## 2022-03-09 HISTORY — PX: IR KYPHO EA ADDL LEVEL THORACIC OR LUMBAR: IMG5520

## 2022-03-09 MED ORDER — FENTANYL CITRATE PF 50 MCG/ML IJ SOSY
25.0000 ug | PREFILLED_SYRINGE | INTRAMUSCULAR | Status: DC | PRN
  Administered 2022-03-09 (×4): 25 ug via INTRAVENOUS

## 2022-03-09 MED ORDER — SODIUM CHLORIDE 0.9 % IV SOLN
INTRAVENOUS | Status: DC
Start: 2022-03-09 — End: 2022-03-10

## 2022-03-09 MED ORDER — MIDAZOLAM HCL 2 MG/2ML IJ SOLN
1.0000 mg | INTRAMUSCULAR | Status: DC | PRN
  Administered 2022-03-09: 1 mg via INTRAVENOUS
  Administered 2022-03-09 (×3): 0.5 mg via INTRAVENOUS

## 2022-03-09 MED ORDER — CEFAZOLIN SODIUM-DEXTROSE 2-4 GM/100ML-% IV SOLN
2.0000 g | INTRAVENOUS | Status: AC
  Administered 2022-03-09: 2 g via INTRAVENOUS

## 2022-03-09 MED ORDER — ACETAMINOPHEN 10 MG/ML IV SOLN
1000.0000 mg | Freq: Once | INTRAVENOUS | Status: AC
  Administered 2022-03-09: 1000 mg via INTRAVENOUS

## 2022-03-11 ENCOUNTER — Inpatient Hospital Stay: Admission: RE | Admit: 2022-03-11 | Payer: PPO | Source: Ambulatory Visit

## 2022-03-15 ENCOUNTER — Telehealth: Payer: Self-pay

## 2022-03-15 NOTE — Telephone Encounter (Signed)
Phone call to pt to follow up from his kyphoplasty on 03/09/22. Pt reports his pain is now a 3/10 instead of a 6 or 7 out of 10. Pt states "it is improving everyday". Pt does have a sacral fracture that he reports still hurts if he is up doing too much but does not wish to have an intervention at this time. Pt denies any signs of infection, redness at the site, draining or fever. Pt has no complaints at this time and will be scheduled for a telephone follow up with Dr. Earleen Newport next week. Pt advised to call back if anything were to change or any concerns arise and we will arrange an in person appointment. Pt verbalized understanding.

## 2022-03-17 ENCOUNTER — Other Ambulatory Visit: Payer: Self-pay | Admitting: Interventional Radiology

## 2022-03-17 DIAGNOSIS — S32010A Wedge compression fracture of first lumbar vertebra, initial encounter for closed fracture: Secondary | ICD-10-CM

## 2022-03-24 ENCOUNTER — Inpatient Hospital Stay: Admission: RE | Admit: 2022-03-24 | Payer: PPO | Source: Ambulatory Visit

## 2022-03-25 ENCOUNTER — Encounter: Payer: Self-pay | Admitting: *Deleted

## 2022-03-25 ENCOUNTER — Ambulatory Visit
Admission: RE | Admit: 2022-03-25 | Discharge: 2022-03-25 | Disposition: A | Discharge status: Home / Self Care | Payer: PPO | Source: Ambulatory Visit | Attending: Interventional Radiology | Admitting: Interventional Radiology

## 2022-03-25 DIAGNOSIS — S32010A Wedge compression fracture of first lumbar vertebra, initial encounter for closed fracture: Secondary | ICD-10-CM

## 2022-03-25 HISTORY — PX: IR RADIOLOGIST EVAL & MGMT: IMG5224

## 2022-03-25 NOTE — Progress Notes (Signed)
Chief Complaint: Follow up L1 and L2 compression fracture treatment  Referring Physician(s): Tykeisha Peer  History of Present Illness: Manuel Buck is a 71 y.o. male presenting as a scheduled follow up to [place] clinic today, SP treatment of L1 and L2 compression fracture with augmentation and KP, 03/09/22.   He joins [place] today with virtual visit, and we confirmed his identity with 2 personal identifiers.   Hx:  We first met Mr Manuel Buck 03/02/22.  From my partner Dr. Patricia Nettle note: Manuel Buck is a 71 y/o M w PMHx significant for tongue CA (2016), T2DM, HTN and prior spine fracture. Pt had T12 fracture and noted chronic L1 fracture, and was treated w T12 KP by Dr. Rudene Christians on 03/19/21. He presents to day in consultation for back pain and evaluation for KP and sacroplasty. Pt reports that he fell off a ladder on 02/09/22, approximately 3 ft up, and landed on his buttocks. He felt immediate pain and also some paresthesias down his L leg which passed after a week. His back and bottom pain persisted and he was seen at and Orthopedic office who obtained a MR L spine revealing acute L1, L2 and partially visualized sacral fractures. He has been wearing a brace that has helped with discomfort, though still suffers from buttock pain. He is joined today by his wife, [name].   He rates his pain as 2/10 at his sacrum and 1/10 at his lower back despite taking pain medications. His pain is present most of the time   Interval hx: We treated Mr Janice's L1 and L2 fractures with augmentation using KP technique 03/09/22.  The L1 level was right unipedicular, and the L2 level was left unipedicular.  He was DC'd after a short observation period.   He tells me today that he feels much better, and that his pain in his "bottom" is essentially gone.  He is referring to his non-displaced S3/S4 fracture, which we used conservative approach, with no intervention.  He feels that his L1 and L2 levels are better, though  with some residual "muscle soreness" to the left and right of midline, corresponding to some post-op pain.  He has no concern for infection, and he continues to improve.   He states that he usually is stiff when he wakes up, and that after a bit of time and tylenol he is feeling better.    He has comfortably resumed his daily activities around the house including labor int he yard such as mowing the lawn.  He would like to return to his work, and needs a medical release.  He feels like his yard work at home is a good surrogate for any duties at work, where he "uses his hands".    Past Medical History:  Diagnosis Date   Cancer ([place])    tongue cancer 2016   Diabetes mellitus without complication (HCC)    Diet Controlled   GERD (gastroesophageal reflux disease)    Hypertension    Skipped heart beats    occassionally-pt asymptomatic 01-21-2015)    Past Surgical History:  Procedure Laterality Date   ANKLE FRACTURE SURGERY Left    BICEPT TENODESIS Right 11/19/2021   Procedure: BICEPS TENODESIS;  Surgeon: Corky Mull, MD;  Location: [facility] ORS;  Service: Orthopedics;  Laterality: Right;   COLONOSCOPY WITH PROPOFOL N/A 09/20/2018   Procedure: COLONOSCOPY WITH PROPOFOL;  Surgeon: Manya Silvas, MD;  Location: [facility] ENDOSCOPY;  Service: Endoscopy;  Laterality: N/A;   DIRECT LARYNGOSCOPY N/A  01/22/2015   Procedure: DIRECT LARYNGOSCOPY/LARYNGOSCOPY WITH BIOPSY;  Surgeon: Clyde Canterbury, MD;  Location: [facility] ORS;  Service: ENT;  Laterality: N/A;   IR KYPHO EA ADDL LEVEL THORACIC OR LUMBAR  03/09/2022   IR KYPHO LUMBAR INC FX REDUCE BONE BX UNI/BIL CANNULATION INC/IMAGING  03/09/2022   KYPHOPLASTY N/A 03/19/2021   Procedure: T 12  KYPHOPLASTY;  Surgeon: Hessie Knows, MD;  Location: [facility] ORS;  Service: Orthopedics;  Laterality: N/A;   LEFT HEART CATH AND CORONARY ANGIOGRAPHY Left 08/01/2019   Procedure: LEFT HEART CATH AND CORONARY ANGIOGRAPHY;  Surgeon: Teodoro Spray, MD;  Location: [place] [facility]  LAB;  Service: Cardiovascular;  Laterality: Left;   NASAL ENDOSCOPY     REVERSE SHOULDER ARTHROPLASTY Right 11/19/2021   Procedure: Right reverse total shoulder arthroplasty with biceps tenodesis;  Surgeon: Corky Mull, MD;  Location: [facility] ORS;  Service: Orthopedics;  Laterality: Right;   SHOULDER ARTHROSCOPY Right    SKIN GRAFT     right hand   TONSILLECTOMY      Allergies: Statins and Sulfa antibiotics  Medications: Prior to Admission medications   Medication Sig Start Date End Date Taking? Authorizing Provider  acetaminophen (TYLENOL) 500 MG tablet Take 500 mg by mouth every 6 (six) hours as needed for mild pain or moderate pain.    [provider]  aspirin EC 81 MG tablet Take 81 mg by mouth daily.     [provider]  Multiple Vitamin (MULTIVITAMIN WITH MINERALS) TABS tablet Take 1 tablet by mouth in the morning.    [provider]  olmesartan-hydrochlorothiazide (BENICAR HCT) 20-12.5 MG tablet Take 1 tablet by mouth daily. 06/09/18   [provider]  omeprazole (PRILOSEC) 20 MG capsule Take 20 mg by mouth daily as needed.    [provider]  oxyCODONE (ROXICODONE) 5 MG immediate release tablet Take 1-2 tablets (5-10 mg total) by mouth every 4 (four) hours as needed for moderate pain or severe pain. Patient not taking: Reported on 03/02/2022 11/19/21   Poggi, Marshall Cork, MD  vitamin B-12 (CYANOCOBALAMIN) 500 MCG tablet Take 500 mcg by mouth in the morning.    [provider]     No family history on file.  Social History   Socioeconomic History   Marital status: Married    Spouse name: Not on file   Number of children: Not on file   Years of education: Not on file   Highest education level: Not on file  Occupational History   Not on file  Tobacco Use   Smoking status: Former    Types: Cigarettes    Quit date: 01/20/1994    Years since quitting: 28.1   Smokeless tobacco: Never  Vaping Use   Vaping Use: Never used   Substance and Sexual Activity   Alcohol use: No   Drug use: No   Sexual activity: Not on file  Other Topics Concern   Not on file  Social History Narrative   Live in private residence with wife, [name]    Social Determinants of Health   Financial Resource Strain: Not on file  Food Insecurity: Not on file  Transportation Needs: Not on file  Physical Activity: Not on file  Stress: Not on file  Social Connections: Not on file       Review of Systems  Review of Systems: A 12 point ROS discussed and pertinent positives are indicated in the HPI above.  All other systems are negative.   Physical Exam No  direct physical exam was performed (except for noted visual exam findings with Video Visits).   Vital Signs: There were no vitals taken for this visit.  Imaging: IR KYPHO LUMBAR INC FX REDUCE BONE BX UNI/BIL CANNULATION INC/IMAGING  Result Date: 03/09/2022 INDICATION: 71 year old male presents for treatment of acute, symptomatic L1 and L2 fractures EXAM: IMAGE GUIDED VERTEBRAL AUGMENTATION OF L1 AND L2 WITH KYPHOPLASTY TECHNIQUE COMPARISON:  MR 02/19/2022 MEDICATIONS: As antibiotic prophylaxis, 2 g Ancef was ordered pre-procedure and administered intravenously within 1 hour of incision. ANESTHESIA/SEDATION: Moderate (conscious) sedation was employed during this procedure. A total of Versed 1.0 mg and Fentanyl 50 mcg was administered intravenously. Moderate Sedation Time: 38 minutes. The patient's level of consciousness and vital signs were monitored continuously by radiology nursing throughout the procedure under my direct supervision. FLUOROSCOPY TIME:  Fluoroscopy Time: (49 mGy) COMPLICATIONS: None PROCEDURE: Following a full explanation of the procedure along with the potentially associated complications, a witnessed informed consent was obtained. Specific risks that were discussed included bleeding, infection, injury to adjacent structures, neurologic injury, embolization of cement  within the veins, failure of the procedure to improve pain, need for further procedure/ surgery, cardiopulmonary collapse, death. The patient understands the risks and wishes to proceed. The patient was placed prone on the fluoroscopic table. Nasal oxygen was administered. Physiologic monitoring was performed throughout the duration of the procedure. The skin overlying the L1 and L2 region was prepped and draped in the usual sterile fashion. The L1 vertebral body was identified and the right pedicle was infiltrated with 1% lidocaine. This was then followed by the advancement of an 8-gauge Medtronic needle through the right pedicle into the mid 1/3 of the vertebral body. The bone auger was advanced to the anterior 1/3, and then confirmed to be across the midline in the AP image. The left pedicle of L2 was then infiltrated with 1% lidocaine. A small incision was made with 11 blade scalpel, and then a Medtronic 8 gauge needle was advanced through the left L2 pedicle into the mid 1/3 of vertebral body. Bone auger was advanced, confirmed to be across the midline in the AP image. Simultaneous inflation of the L1 and L2 cannula balloons was performed under fluoroscopic observation. Methylmethacrylate mixture was then reconstituted. Under biplane intermittent fluoroscopy, the methylmethacrylate was then injected into first the cavity of L2, and then subsequently the cavity at the L1 vertebral body with good filling of the cavities as well as across the midline into the bilateral vertebral bodies at both levels. No extravasation was noted posteriorly into the spinal canal. No epidural venous contamination was seen. The needles were then removed. Hemostasis was achieved at the skin entry site. There were no acute complications. Patient tolerated the procedure well. The patient was observed for 30 minutes and returned to her room in good condition. IMPRESSION: Status post image guided vertebral augmentation using kyphoplasty  technique of the L1 and L2 vertebral bodies, via single pedicular approach of the right pedicle and left pedicle respectively. Signed, Dulcy Fanny. Nadene Rubins, RPVI [facility] Radiology Electronically Signed   By: Corrie Mckusick D.O.   On: 03/09/2022 14:04   IR KYPHO EA ADDL LEVEL THORACIC OR LUMBAR  Result Date: 03/09/2022 INDICATION: 71 year old male presents for treatment of acute, symptomatic L1 and L2 fractures EXAM: IMAGE GUIDED VERTEBRAL AUGMENTATION OF L1 AND L2 WITH KYPHOPLASTY TECHNIQUE COMPARISON:  MR 02/19/2022 MEDICATIONS: As antibiotic prophylaxis, 2 g Ancef was ordered pre-procedure and administered intravenously within 1  hour of incision. ANESTHESIA/SEDATION: Moderate (conscious) sedation was employed during this procedure. A total of Versed 1.0 mg and Fentanyl 50 mcg was administered intravenously. Moderate Sedation Time: 38 minutes. The patient's level of consciousness and vital signs were monitored continuously by radiology nursing throughout the procedure under my direct supervision. FLUOROSCOPY TIME:  Fluoroscopy Time: (49 mGy) COMPLICATIONS: None PROCEDURE: Following a full explanation of the procedure along with the potentially associated complications, a witnessed informed consent was obtained. Specific risks that were discussed included bleeding, infection, injury to adjacent structures, neurologic injury, embolization of cement within the veins, failure of the procedure to improve pain, need for further procedure/ surgery, cardiopulmonary collapse, death. The patient understands the risks and wishes to proceed. The patient was placed prone on the fluoroscopic table. Nasal oxygen was administered. Physiologic monitoring was performed throughout the duration of the procedure. The skin overlying the L1 and L2 region was prepped and draped in the usual sterile fashion. The L1 vertebral body was identified and the right pedicle was  infiltrated with 1% lidocaine. This was then followed by the advancement of an 8-gauge Medtronic needle through the right pedicle into the mid 1/3 of the vertebral body. The bone auger was advanced to the anterior 1/3, and then confirmed to be across the midline in the AP image. The left pedicle of L2 was then infiltrated with 1% lidocaine. A small incision was made with 11 blade scalpel, and then a Medtronic 8 gauge needle was advanced through the left L2 pedicle into the mid 1/3 of vertebral body. Bone auger was advanced, confirmed to be across the midline in the AP image. Simultaneous inflation of the L1 and L2 cannula balloons was performed under fluoroscopic observation. Methylmethacrylate mixture was then reconstituted. Under biplane intermittent fluoroscopy, the methylmethacrylate was then injected into first the cavity of L2, and then subsequently the cavity at the L1 vertebral body with good filling of the cavities as well as across the midline into the bilateral vertebral bodies at both levels. No extravasation was noted posteriorly into the spinal canal. No epidural venous contamination was seen. The needles were then removed. Hemostasis was achieved at the skin entry site. There were no acute complications. Patient tolerated the procedure well. The patient was observed for 30 minutes and returned to her room in good condition. IMPRESSION: Status post image guided vertebral augmentation using kyphoplasty technique of the L1 and L2 vertebral bodies, via single pedicular approach of the right pedicle and left pedicle respectively. Signed, Dulcy Fanny. Nadene Rubins, RPVI [facility] Inter[facility] Radiology Special[facility] Radiology Electronically Signed   By: Corrie Mckusick D.O.   On: 03/09/2022 14:04   DG Radiologist Eval And Mgmt  Result Date: 03/09/2022 EXAM: NEW PATIENT OFFICE VISIT CHIEF COMPLAINT: See below HISTORY OF PRESENT ILLNESS: See below REVIEW OF SYSTEMS: See below PHYSICAL  EXAMINATION: See below ASSESSMENT AND PLAN: Please see dictated consultation note in [place]. Michaelle Birks, MD [facility] and Interventional Radiology Specialists [facility] Radiology Electronically Signed   By: Michaelle Birks M.D.   On: 03/09/2022 09:09   MR PELVIS WO CONTRAST  Result Date: 03/05/2022 CLINICAL DATA:  [place] several weeks ago from ladder, pain at waistline EXAM: MRI PELVIS WITHOUT CONTRAST TECHNIQUE: Multiplanar multisequence MR imaging of the pelvis was performed. No intravenous contrast was administered. COMPARISON:  None Available. FINDINGS: Urinary Tract: The bladder is mildly distended with circumferential bladder wall thickening. Bowel:  Sigmoid diverticulosis. Vascular/Lymphatic: No pathologically enlarged lymph nodes. No significant vascular abnormality seen.  Reproductive: Enlarged prostate gland. Small bilateral scrotal hydroceles. Other:  Small fat containing left inguinal hernia. Musculoskeletal: There is intense marrow edema within the sacrum from at S3 and S4 in zones 1 through 3, with peripheral sacral ala edema in zone 1 at the level of S2. There is a low signal intensity fracture line extending transversely across the S3 vertebral body. Partially visualized L2 vertebral body fracture, see recent lumbar spine MRI on 02/19/2022. Chronic insertional tear of the distal gluteus minimus tendons, right worse than left, with associated chronic muscle atrophy. Severe atrophy at the gluteus medius muscle myotendinous junctions bilaterally. There is severe paraspinal muscle atrophy and atrophy of the tensor fascia lata muscles bilaterally. Severe anterior abdominal wall muscle atrophy. There is severe atrophy of the bilateral quadratus femoris muscles. There is mild reactive edema signal within the bilateral hip adductors. Mild subcutaneous edema along the lower back. No focal fluid collection. IMPRESSION: Nondisplaced transverse sacral fracture at the level of S3, with intense associated  marrow edema in zones 1 through 3 at S3 and S4 and bilateral zone 1 edema at the level of S2. Partially visualized L2 vertebral body fracture, see recent lumbar spine MRI 02/19/2022. Mild bladder mildly distended with circumferential bladder wall thickening, possibly related to enlarged prostate gland. Small fat containing left inguinal hernia. Severe muscle atrophy as described above. Electronically Signed   By: Maurine Simmering M.D.   On: 03/05/2022 14:30    Labs:  CBC: Recent Labs    11/05/21 1209  WBC 4.2  HGB 13.8  HCT 40.9  PLT 310    COAGS: No results for input(s): "INR", "APTT" in the last 8760 hours.  BMP: Recent Labs    11/05/21 1209  NA 136  K 4.0  CL 98  CO2 31  GLUCOSE 113*  BUN 35*  CALCIUM 9.5  CREATININE 1.22  GFRNONAA >60    LIVER FUNCTION TESTS: Recent Labs    11/05/21 1209  BILITOT 1.3*  AST 31  ALT 22  ALKPHOS 51  PROT 7.4  ALBUMIN 4.4    TUMOR MARKERS: No results for input(s): "AFPTM", "CEA", "CA199", "CHROMGRNA" in the last 8760 hours.  Assessment and Plan:  Mr. Dubberly is 71 yo male SP treatment of L1 and L2 compression fractures with vertebral augmentation with KP technique, and he is feeling much better.   He also is feeling that the pain/symptoms related to known sacral s3/s4 fracture are nearly gone, with conservative management.    We discussed today some strategies for managing some residual muscle soreness in the back related to post-op pain, which can be typical OTC's such as tylenol -- which seems to be effective for him -- as well as ice.  20 minutes of ice at a time is generally safe.    He also would like to go back to work which I think is safe, so we will provide him a clearance.    We are happy to see him on as needed basis.   Plan: - We will provide him with clearance to return to work, which we can mail to his address.     Electronically Signed: Corrie Mckusick 03/25/2022, 10:45 AM   I spent a total of    15 Minutes  in remote  clinical consultation, greater than 50% of which was counseling/coordinating care for follow up of L1 and L2 compression fracture treatment, SP KP.    Visit type: Audio only (telephone). Audio (no video) only due to patient's lack of internet/smartphone  capability. Alternative for in-person consultation at [facility], [address], [place], [place]. This visit type was conducted due to national recommendations for restrictions regarding the COVID-19 Pandemic (e.g. social distancing).  This format is felt to be most appropriate for this patient at this time.  All issues noted in this document were discussed and addressed.

## 2022-06-07 DIAGNOSIS — Z96611 Presence of right artificial shoulder joint: Secondary | ICD-10-CM | POA: Diagnosis not present

## 2022-06-07 DIAGNOSIS — M12811 Other specific arthropathies, not elsewhere classified, right shoulder: Secondary | ICD-10-CM | POA: Diagnosis not present

## 2022-06-28 DIAGNOSIS — R1013 Epigastric pain: Secondary | ICD-10-CM | POA: Diagnosis not present

## 2022-06-28 DIAGNOSIS — T466X5A Adverse effect of antihyperlipidemic and antiarteriosclerotic drugs, initial encounter: Secondary | ICD-10-CM | POA: Diagnosis not present

## 2022-06-28 DIAGNOSIS — E1169 Type 2 diabetes mellitus with other specified complication: Secondary | ICD-10-CM | POA: Diagnosis not present

## 2022-06-28 DIAGNOSIS — Z0000 Encounter for general adult medical examination without abnormal findings: Secondary | ICD-10-CM | POA: Diagnosis not present

## 2022-06-28 DIAGNOSIS — Z23 Encounter for immunization: Secondary | ICD-10-CM | POA: Diagnosis not present

## 2022-06-28 DIAGNOSIS — E782 Mixed hyperlipidemia: Secondary | ICD-10-CM | POA: Diagnosis not present

## 2022-06-28 DIAGNOSIS — I10 Essential (primary) hypertension: Secondary | ICD-10-CM | POA: Diagnosis not present

## 2022-06-28 DIAGNOSIS — M791 Myalgia, unspecified site: Secondary | ICD-10-CM | POA: Diagnosis not present

## 2022-06-28 DIAGNOSIS — I739 Peripheral vascular disease, unspecified: Secondary | ICD-10-CM | POA: Diagnosis not present

## 2022-07-01 ENCOUNTER — Other Ambulatory Visit: Payer: Self-pay | Admitting: Internal Medicine

## 2022-07-01 DIAGNOSIS — R1013 Epigastric pain: Secondary | ICD-10-CM

## 2022-07-08 ENCOUNTER — Ambulatory Visit
Admission: RE | Admit: 2022-07-08 | Discharge: 2022-07-08 | Disposition: A | Discharge status: Home / Self Care | Payer: PPO | Source: Ambulatory Visit | Attending: Internal Medicine | Admitting: Internal Medicine

## 2022-07-08 DIAGNOSIS — R1013 Epigastric pain: Secondary | ICD-10-CM | POA: Diagnosis not present

## 2022-10-14 DIAGNOSIS — H6123 Impacted cerumen, bilateral: Secondary | ICD-10-CM | POA: Diagnosis not present

## 2022-10-14 DIAGNOSIS — R221 Localized swelling, mass and lump, neck: Secondary | ICD-10-CM | POA: Diagnosis not present

## 2022-10-14 DIAGNOSIS — H6062 Unspecified chronic otitis externa, left ear: Secondary | ICD-10-CM | POA: Diagnosis not present

## 2022-11-07 NOTE — Progress Notes (Unsigned)
Cardiology Office Note  Date:  11/08/2022   ID:  Manuel Buck [birth year], MRN [id number]  PCP:  Kirk Ruths, MD   Chief Complaint  Patient presents with   New Patient (Initial Visit)    Establish care for PVC's and pre-syncope. Medications reviewed by the patient verbally. Patient c/o pain/cramping/ weakness in legs with walking, dizziness comes & goes, balance is off, bilateral LE edema and shortness of breath at times.     HPI:  Mr. Manuel Buck is a 72 year old gentleman with past medical history of Diabetes type 2 Hypertension Hyperlipidemia PVCs Cardiac catheterization November 2020 with no coronary disease Who presents for follow-up of his PVCs, near syncope CAD, PAD Previously followed at [facility] cardiology  History of abnormal ETT - poor exercise tolerance with exercise induced PVCs   Left heart catheterization on 08/01/19 revealed normal LV systolic function and normal coronary arteries. No evidence of CAD.   Echocardiogram revealed low normal LV systolic function with an EF estimated at 50% with basal to mid inferolateral wall hypokinesis.   Stress ETT revealed poor exercise tolerance with exercise induced PVCs.  Holter monitor revealed predominately sinus rhythm at an average rate of 72bpm with frequent PVCs (3.8% burden) and several nonsustained runs of SVT and ventricular runs.   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 Enjoys fishing  CT scans pulled up showing mild aortic atherosclerosis in the arch, descending aorta, coronary calcification noted  Total cholesterol 229  EKG personally reviewed by myself on todays visit Normal sinus rhythm with rate 59 bpm interventricular conduction delay, left axis deviation  Orthostatics Supine 140/71 pulse 61 Sitting 153/75  pulse 65 Standing 114/67 pulse 64 standing 3 minutes 120/68 pulse 60   PMH:   has a past medical history of Cancer ([place]), Diabetes mellitus without complication ([place]), GERD (gastroesophageal reflux disease), Hypertension, and Skipped heart beats.  PSH:    Past Surgical History:  Procedure Laterality Date   ANKLE FRACTURE SURGERY Left    BICEPT TENODESIS Right 11/19/2021   Procedure: BICEPS TENODESIS;  Surgeon: Corky Mull, MD;  Location: [facility] ORS;  Service: Orthopedics;  Laterality: Right;   COLONOSCOPY WITH PROPOFOL N/A 09/20/2018   Procedure: COLONOSCOPY WITH PROPOFOL;  Surgeon: Manya Silvas, MD;  Location: [facility] ENDOSCOPY;  Service: Endoscopy;  Laterality: N/A;   DIRECT LARYNGOSCOPY N/A 01/22/2015   Procedure: DIRECT LARYNGOSCOPY/LARYNGOSCOPY WITH BIOPSY;  Surgeon: Clyde Canterbury, MD;  Location: [facility] ORS;  Service: ENT;  Laterality: N/A;   IR KYPHO EA ADDL LEVEL THORACIC OR LUMBAR  03/09/2022   IR KYPHO LUMBAR INC FX REDUCE BONE BX UNI/BIL CANNULATION INC/IMAGING  03/09/2022   IR RADIOLOGIST EVAL & MGMT  03/25/2022   KYPHOPLASTY N/A 03/19/2021   Procedure: T 12  KYPHOPLASTY;  Surgeon: Hessie Knows, MD;  Location: [facility] ORS;  Service: Orthopedics;  Laterality: N/A;   LEFT HEART CATH AND CORONARY ANGIOGRAPHY Left 08/01/2019   Procedure: LEFT HEART CATH AND CORONARY ANGIOGRAPHY;  Surgeon: Teodoro Spray, MD;  Location: [facility] [place] [facility];  Service: Cardiovascular;  Laterality: Left;   NASAL ENDOSCOPY     REVERSE SHOULDER ARTHROPLASTY Right 11/19/2021   Procedure: Right reverse total shoulder arthroplasty with biceps tenodesis;  Surgeon: Corky Mull, MD;  Location: [facility] ORS;  Service: Orthopedics;  Laterality: Right;  SHOULDER ARTHROSCOPY Right    SKIN GRAFT     right hand   TONSILLECTOMY      Current Outpatient Medications  Medication Sig Dispense Refill   acetaminophen (TYLENOL) 500 MG tablet Take 500 mg by mouth every 6 (six) hours as needed for mild pain or moderate pain.      aspirin EC 81 MG tablet Take 81 mg by mouth daily.      ezetimibe (ZETIA) 10 MG tablet Take 1 tablet (10 mg total) by mouth daily. 90 tablet 2   Multiple Vitamin (MULTIVITAMIN WITH MINERALS) TABS tablet Take 1 tablet by mouth in the morning.     olmesartan-hydrochlorothiazide (BENICAR HCT) 20-12.5 MG tablet Take 1 tablet by mouth daily.  11   omeprazole (PRILOSEC) 20 MG capsule Take 20 mg by mouth daily as needed.     vitamin B-12 (CYANOCOBALAMIN) 500 MCG tablet Take 500 mcg by mouth in the morning.     oxyCODONE (ROXICODONE) 5 MG immediate release tablet Take 1-2 tablets (5-10 mg total) by mouth every 4 (four) hours as needed for moderate pain or severe pain. (Patient not taking: Reported on 03/02/2022) 40 tablet 0   No current facility-administered medications for this visit.     Allergies:   Statins and Sulfa antibiotics   Social History:  The patient  reports that he quit smoking about 28 years ago. His smoking use included cigarettes and cigars. He has a 5.00 pack-year smoking history. He has never used smokeless tobacco. He reports that he does not drink alcohol and does not use drugs.   Family History:   family history includes COPD in his mother; Cancer in his father; Diabetes in his brother; Heart attack (age of onset: 68) in his brother; Heart disease in his brother; Hyperlipidemia in his brother and mother; Hypertension in his brother; Valvular heart disease in his brother and mother.    Review of Systems: Review of Systems  Constitutional: Negative.   HENT: Negative.    Respiratory: Negative.    Cardiovascular: Negative.   Gastrointestinal: Negative.   Musculoskeletal: Negative.   Neurological: Negative.   Psychiatric/Behavioral: Negative.    All other systems reviewed and are negative.    PHYSICAL EXAM: VS:  BP 120/60 (BP Location: Right Arm, Patient Position: Sitting, Cuff Size: Normal)   Pulse (!) 59   Ht '5\' 11"'$  (1.803 m)   Wt 194 lb 2 oz (88.1 kg)   SpO2 95%    BMI 27.07 kg/m  , BMI Body mass index is 27.07 kg/m. GEN: Well nourished, well developed, in no acute distress HEENT: normal Neck: no JVD, carotid bruits, or masses Cardiac: RRR; no murmurs, rubs, or gallops,no edema  Respiratory:  clear to auscultation bilaterally, normal work of breathing GI: soft, nontender, nondistended, + BS MS: no deformity or atrophy Skin: warm and dry, no rash Neuro:  Strength and sensation are intact Psych: euthymic mood, full affect   Recent Labs: No results found for requested labs within last 365 days.    Lipid Panel No results found for: "CHOL", "HDL", "LDLCALC", "TRIG"    Wt Readings from Last 3 Encounters:  11/08/22 194 lb 2 oz (88.1 kg)  03/02/22 198 lb (89.8 kg)  11/19/21 190 lb (86.2 kg)       ASSESSMENT AND PLAN:  Problem List Items Addressed This Visit       Cardiology Problems   Essential (primary) hypertension   Relevant Medications   ezetimibe (ZETIA) 10 MG tablet   Other Relevant  Orders   EKG 12-Lead   Other Visit Diagnoses     Aortic atherosclerosis ([place])    -  Primary   Relevant Medications   ezetimibe (ZETIA) 10 MG tablet   Coronary artery calcification seen on CAT scan       Relevant Medications   ezetimibe (ZETIA) 10 MG tablet   Mixed hyperlipidemia       Relevant Medications   ezetimibe (ZETIA) 10 MG tablet   Near syncope       Relevant Medications   ezetimibe (ZETIA) 10 MG tablet      Near-syncope Seems to present 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 after being standing in 1 place for long period of time Likely dropping pressure, orthostasis Orthostatics mildly positive today Recommend he stay hydrated, periodically sit down, use compression hose Prefers not to stop his HCTZ given leg swelling  Chronic leg swelling Likely component of venous insufficiency Recommended compression hose  Hyperlipidemia Will start Zetia, may need PCSK9 inhibitor Reports having statin myalgias  Coronary  calcification Heavy calcification noted on CT scan Images pulled up and reviewed, we will work on lipid management  Aortic atherosclerosis Seen on CT scan, mild diffuse Will work on cholesterol as above   Total encounter time more than 60 minutes  Greater than 50% was spent in counseling and coordination of care with the patient    Signed, Esmond Plants, M.D., Ph.D. [place], [place]

## 2022-11-08 ENCOUNTER — Encounter: Payer: Self-pay | Admitting: Cardiovascular Disease

## 2022-11-08 ENCOUNTER — Ambulatory Visit: Payer: PPO | Attending: Cardiovascular Disease | Admitting: Cardiovascular Disease

## 2022-11-08 VITALS — BP 120/60 | HR 59 | Ht 71.0 in | Wt 194.1 lb

## 2022-11-08 DIAGNOSIS — I10 Essential (primary) hypertension: Secondary | ICD-10-CM

## 2022-11-08 DIAGNOSIS — R55 Syncope and collapse: Secondary | ICD-10-CM | POA: Diagnosis not present

## 2022-11-08 DIAGNOSIS — I700 Atherosclerosis of aorta: Secondary | ICD-10-CM | POA: Diagnosis not present

## 2022-11-08 DIAGNOSIS — E782 Mixed hyperlipidemia: Secondary | ICD-10-CM | POA: Diagnosis not present

## 2022-11-08 DIAGNOSIS — I2510 Atherosclerotic heart disease of native coronary artery without angina pectoris: Secondary | ICD-10-CM

## 2022-11-08 MED ORDER — EZETIMIBE 10 MG PO TABS: 10.0000 mg | ORAL_TABLET | Freq: Every day | ORAL | 2 refills | Status: DC

## 2022-11-08 NOTE — Patient Instructions (Addendum)
Medication Instructions:  Please start zetia 10 mg daily for cholesterol  If you need a refill on your cardiac medications before your next appointment, please call your pharmacy.   Lab work: No new labs needed  Testing/Procedures: No new testing needed  Follow-Up: At [facility], you and your health needs are our priority.  As part of our continuing mission to provide you with exceptional heart care, we have created designated Provider Care Teams.  These Care Teams include your primary Cardiologist (physician) and Advanced Practice Providers (APPs -  Physician Assistants and Nurse Practitioners) who all work together to provide you with the care you need, when you need it.  You will need a follow up appointment in 12 months  Providers on your designated Care Team:   Murray Hodgkins, NP Christell Faith, PA-C Cadence Kathlen Mody, [place]  COVID-19 Vaccine Information can be found at: [email or web address].uk For questions related to vaccine distribution or appointments, please email [email or web address] or call [phone].

## 2022-11-11 DIAGNOSIS — K219 Gastro-esophageal reflux disease without esophagitis: Secondary | ICD-10-CM | POA: Diagnosis not present

## 2022-11-11 DIAGNOSIS — R1319 Other dysphagia: Secondary | ICD-10-CM | POA: Diagnosis not present

## 2022-12-27 DIAGNOSIS — I739 Peripheral vascular disease, unspecified: Secondary | ICD-10-CM | POA: Diagnosis not present

## 2022-12-27 DIAGNOSIS — E782 Mixed hyperlipidemia: Secondary | ICD-10-CM | POA: Diagnosis not present

## 2022-12-27 DIAGNOSIS — T466X5A Adverse effect of antihyperlipidemic and antiarteriosclerotic drugs, initial encounter: Secondary | ICD-10-CM | POA: Diagnosis not present

## 2022-12-27 DIAGNOSIS — I10 Essential (primary) hypertension: Secondary | ICD-10-CM | POA: Diagnosis not present

## 2022-12-27 DIAGNOSIS — M791 Myalgia, unspecified site: Secondary | ICD-10-CM | POA: Diagnosis not present

## 2022-12-27 DIAGNOSIS — M48062 Spinal stenosis, lumbar region with neurogenic claudication: Secondary | ICD-10-CM | POA: Diagnosis not present

## 2022-12-27 DIAGNOSIS — E1169 Type 2 diabetes mellitus with other specified complication: Secondary | ICD-10-CM | POA: Diagnosis not present

## 2022-12-27 DIAGNOSIS — R413 Other amnesia: Secondary | ICD-10-CM | POA: Diagnosis not present

## 2023-01-24 DIAGNOSIS — Z85810 Personal history of malignant neoplasm of tongue: Secondary | ICD-10-CM | POA: Diagnosis not present

## 2023-01-24 DIAGNOSIS — H6123 Impacted cerumen, bilateral: Secondary | ICD-10-CM | POA: Diagnosis not present

## 2023-01-24 DIAGNOSIS — H6062 Unspecified chronic otitis externa, left ear: Secondary | ICD-10-CM | POA: Diagnosis not present

## 2023-01-31 DIAGNOSIS — H2513 Age-related nuclear cataract, bilateral: Secondary | ICD-10-CM | POA: Diagnosis not present

## 2023-01-31 DIAGNOSIS — E119 Type 2 diabetes mellitus without complications: Secondary | ICD-10-CM | POA: Diagnosis not present

## 2023-03-04 IMAGING — MR MR PELVIS W/O CM
4 of 5 series · 22 of 48 positions shown · non-contrast
Comparison: None Available.

CLINICAL DATA: Fell several weeks ago from ladder, pain at
waistline

EXAM:
MRI PELVIS WITHOUT CONTRAST
TECHNIQUE: Multiplanar multisequence MR imaging of the pelvis was performed. No
intravenous contrast was administered.

[Series 8: T1 · axial · 4.0mm · 0.74mm/px · z∈[-110,+155]mm · 9 of 54 slices shown (1 of 2)]
[im 1/54]
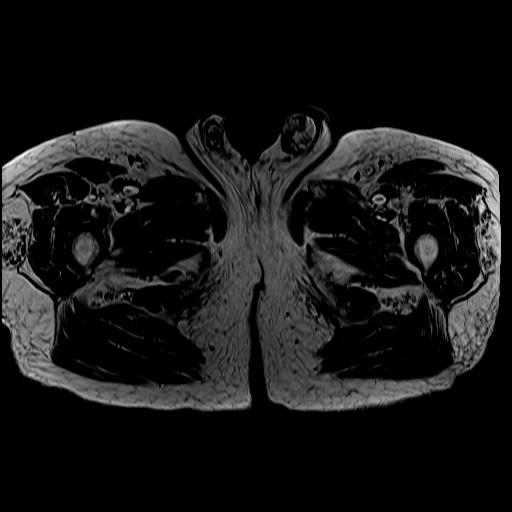
[im 7/54]
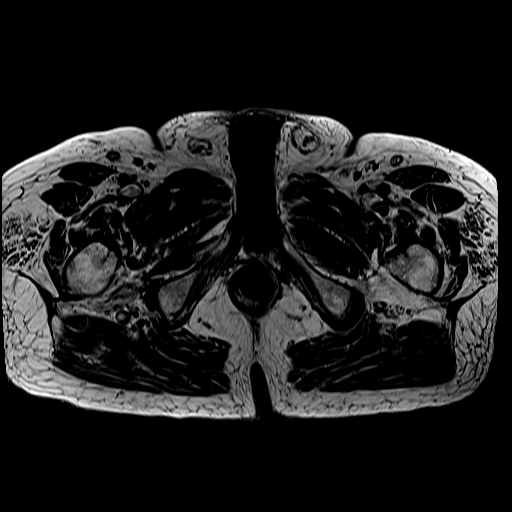
[im 14/54]
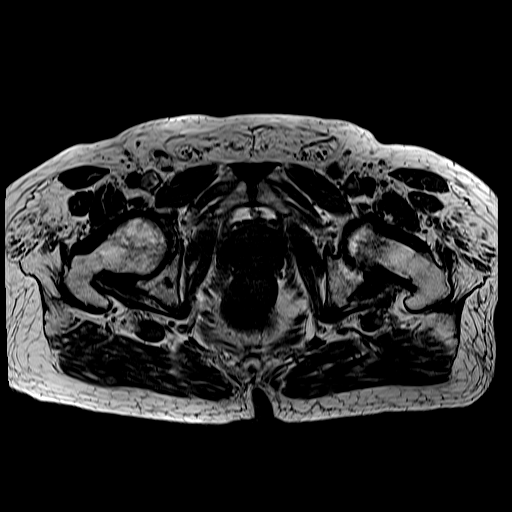
[im 20/54]
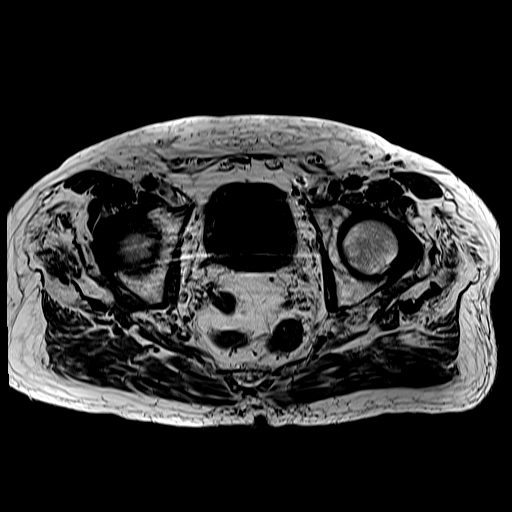
[im 27/54]
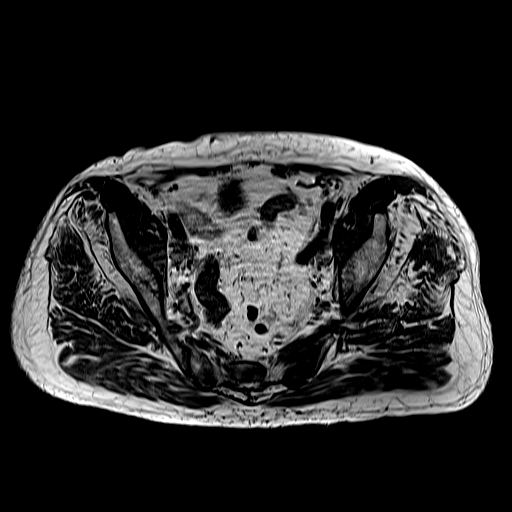
[im 34/54]
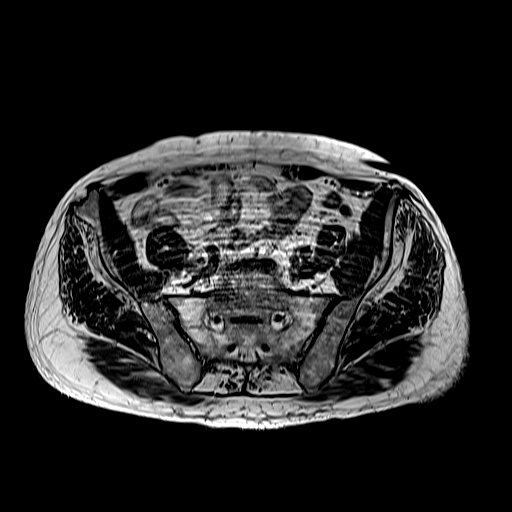
[im 40/54]
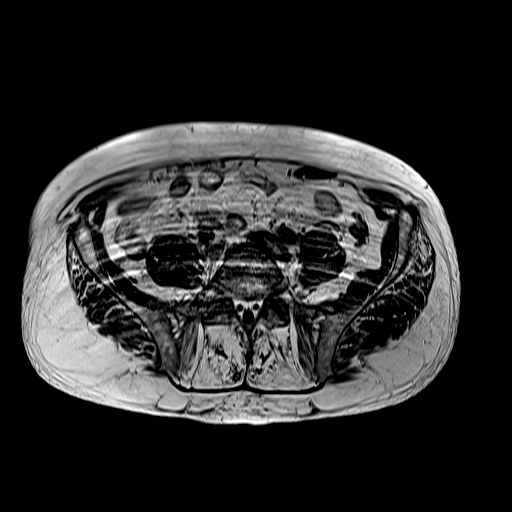
[im 47/54]
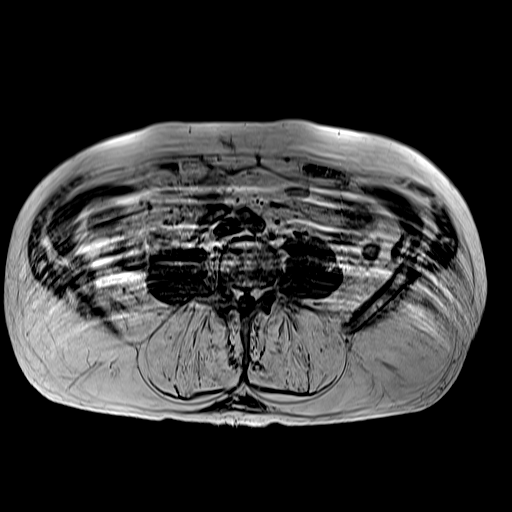
[im 54/54]
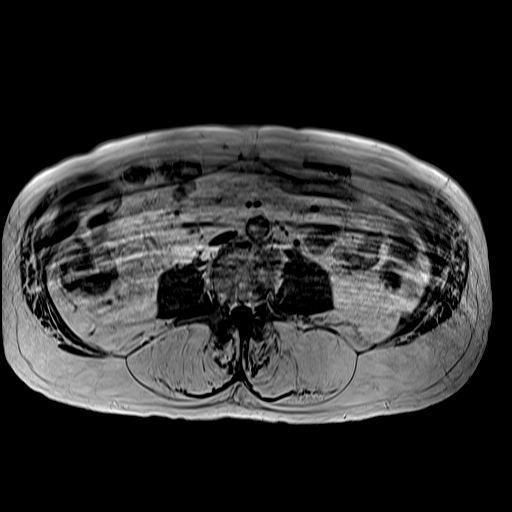

[Series 9: T2 fat-sat · axial · 4.0mm · 0.74mm/px · z∈[-110,+125]mm · 7 of 54 slices shown]
[im 1/54]
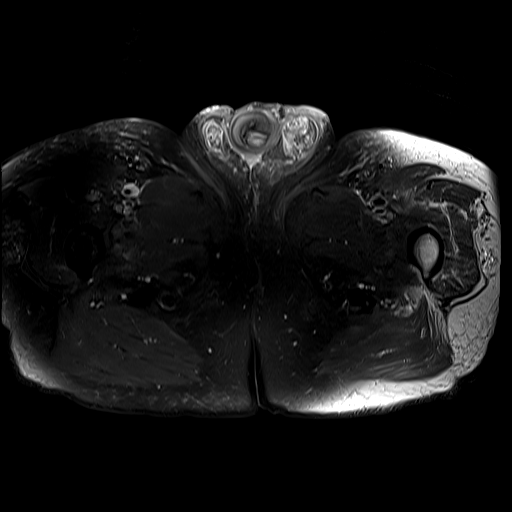
[im 6/54]
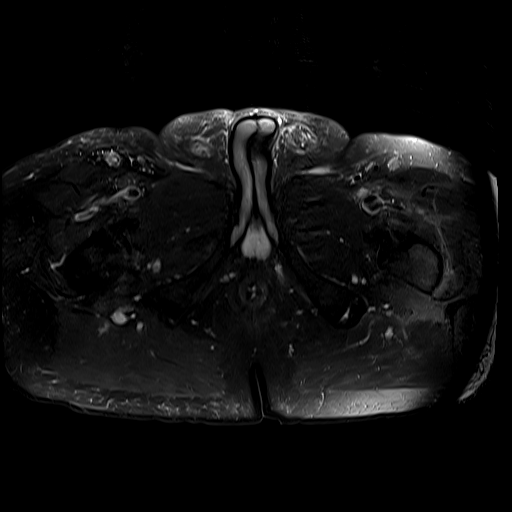
[im 18/54]
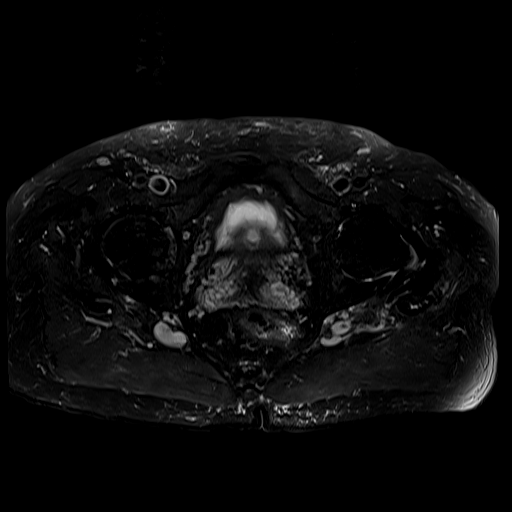
[im 24/54]
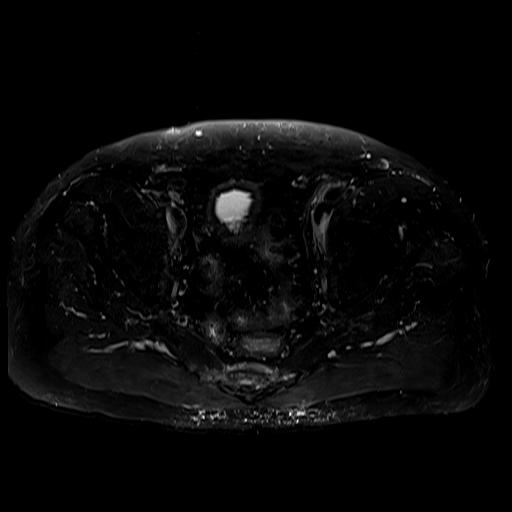
[im 30/54]
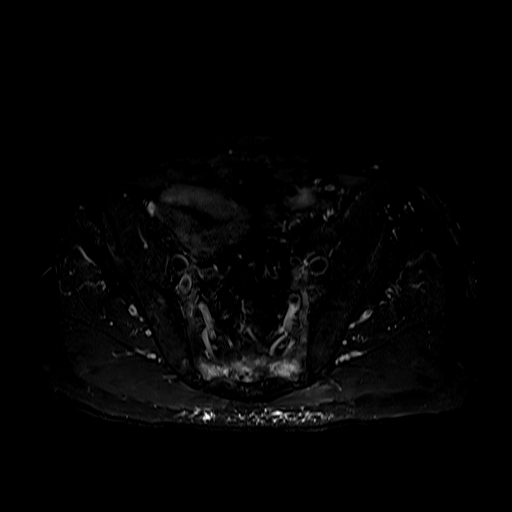
[im 36/54]
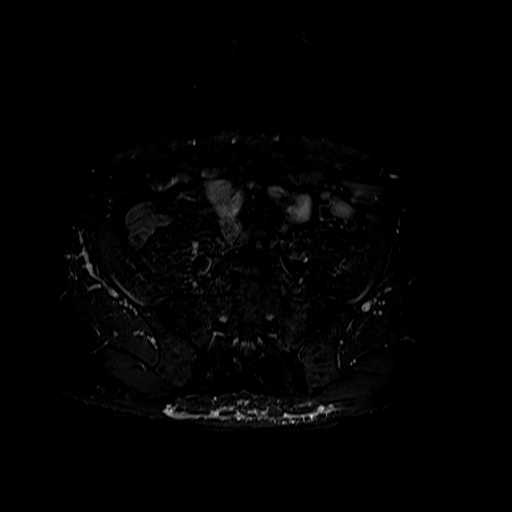
[im 48/54]
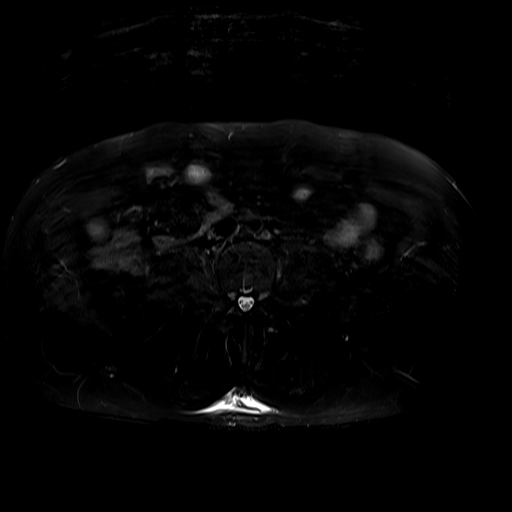

[Series 10: STIR · coronal · 4.0mm · 1.25mm/px · 3 of 44 slices shown]
[im 7/44]
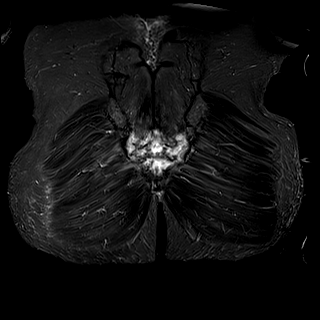
[im 25/44]
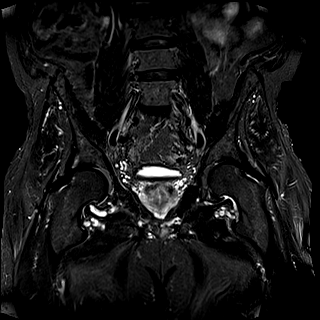
[im 37/44]
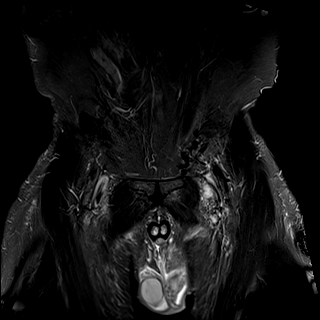

[Series 11: T1 · coronal · 4.0mm · 1.25mm/px · 3 of 44 slices shown (2 of 2)]
[im 7/44]
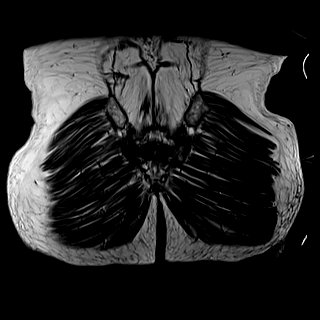
[im 25/44]
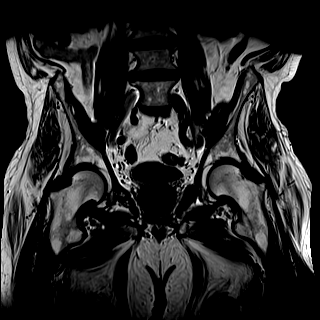
[im 37/44]
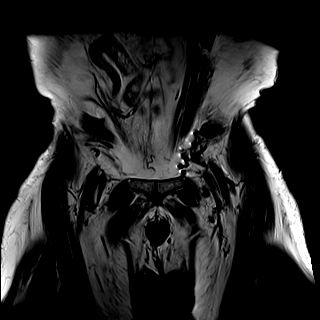

[22 of 48 positions shown; findings below may reference images not displayed]

FINDINGS: Urinary Tract: The bladder is mildly distended with circumferential
bladder wall thickening.

Bowel:  Sigmoid diverticulosis.

Vascular/Lymphatic: No pathologically enlarged lymph nodes. No
significant vascular abnormality seen.

Reproductive: Enlarged prostate gland. Small bilateral scrotal
hydroceles.

Other:  Small fat containing left inguinal hernia.

Musculoskeletal: There is intense marrow edema within the sacrum
from at S3 and S4 in zones 1 through 3, with peripheral sacral ala
edema in zone 1 at the level of S2. There is a low signal intensity
fracture line extending transversely across the S3 vertebral body.
Partially visualized L2 vertebral body fracture, see recent lumbar
spine MRI on 02/19/2022.

Chronic insertional tear of the distal gluteus minimus tendons,
right worse than left, with associated chronic muscle atrophy.

Severe atrophy at the gluteus medius muscle myotendinous junctions
bilaterally. There is severe paraspinal muscle atrophy and atrophy
of the tensor fascia lata muscles bilaterally. Severe anterior
abdominal wall muscle atrophy. There is severe atrophy of the
bilateral quadratus femoris muscles. There is mild reactive edema
signal within the bilateral hip adductors.

Mild subcutaneous edema along the lower back. No focal fluid
collection.
IMPRESSION: Nondisplaced transverse sacral fracture at the level of S3, with
intense associated marrow edema in zones 1 through 3 at S3 and S4
and bilateral zone 1 edema at the level of S2.

Partially visualized L2 vertebral body fracture, see recent lumbar
spine MRI 02/19/2022.

Mild bladder mildly distended with circumferential bladder wall
thickening, possibly related to enlarged prostate gland.

Small fat containing left inguinal hernia.

Severe muscle atrophy as described above.

## 2023-03-28 DIAGNOSIS — G8929 Other chronic pain: Secondary | ICD-10-CM | POA: Diagnosis not present

## 2023-03-28 DIAGNOSIS — M545 Low back pain, unspecified: Secondary | ICD-10-CM | POA: Diagnosis not present

## 2023-03-28 DIAGNOSIS — I872 Venous insufficiency (chronic) (peripheral): Secondary | ICD-10-CM | POA: Diagnosis not present

## 2023-03-28 DIAGNOSIS — B351 Tinea unguium: Secondary | ICD-10-CM | POA: Diagnosis not present

## 2023-03-28 DIAGNOSIS — G629 Polyneuropathy, unspecified: Secondary | ICD-10-CM | POA: Diagnosis not present

## 2023-05-30 DIAGNOSIS — H6123 Impacted cerumen, bilateral: Secondary | ICD-10-CM | POA: Diagnosis not present

## 2023-05-30 DIAGNOSIS — Z85810 Personal history of malignant neoplasm of tongue: Secondary | ICD-10-CM | POA: Diagnosis not present

## 2023-05-30 DIAGNOSIS — H6062 Unspecified chronic otitis externa, left ear: Secondary | ICD-10-CM | POA: Diagnosis not present

## 2023-07-04 DIAGNOSIS — R200 Anesthesia of skin: Secondary | ICD-10-CM | POA: Diagnosis not present

## 2023-07-04 DIAGNOSIS — R202 Paresthesia of skin: Secondary | ICD-10-CM | POA: Diagnosis not present

## 2023-08-12 ENCOUNTER — Other Ambulatory Visit: Payer: Self-pay | Admitting: Cardiovascular Disease

## 2023-09-28 DIAGNOSIS — H6062 Unspecified chronic otitis externa, left ear: Secondary | ICD-10-CM | POA: Diagnosis not present

## 2023-09-28 DIAGNOSIS — H6123 Impacted cerumen, bilateral: Secondary | ICD-10-CM | POA: Diagnosis not present

## 2023-10-12 DIAGNOSIS — G8929 Other chronic pain: Secondary | ICD-10-CM | POA: Diagnosis not present

## 2023-10-12 DIAGNOSIS — I872 Venous insufficiency (chronic) (peripheral): Secondary | ICD-10-CM | POA: Diagnosis not present

## 2023-10-12 DIAGNOSIS — G629 Polyneuropathy, unspecified: Secondary | ICD-10-CM | POA: Diagnosis not present

## 2023-10-12 DIAGNOSIS — M545 Low back pain, unspecified: Secondary | ICD-10-CM | POA: Diagnosis not present

## 2023-10-12 DIAGNOSIS — B351 Tinea unguium: Secondary | ICD-10-CM | POA: Diagnosis not present

## 2023-11-09 DIAGNOSIS — H2513 Age-related nuclear cataract, bilateral: Secondary | ICD-10-CM | POA: Diagnosis not present

## 2023-11-09 DIAGNOSIS — E119 Type 2 diabetes mellitus without complications: Secondary | ICD-10-CM | POA: Diagnosis not present

## 2023-11-10 ENCOUNTER — Other Ambulatory Visit: Payer: Self-pay | Admitting: Cardiovascular Disease

## 2023-11-10 NOTE — Telephone Encounter (Signed)
 Please contact pt for future appointment. Pt due for 12 month f/u.

## 2023-11-10 NOTE — Telephone Encounter (Signed)
 Pt scheduled on 5/5

## 2023-11-21 DIAGNOSIS — I10 Essential (primary) hypertension: Secondary | ICD-10-CM | POA: Diagnosis not present

## 2023-11-21 DIAGNOSIS — E782 Mixed hyperlipidemia: Secondary | ICD-10-CM | POA: Diagnosis not present

## 2023-11-21 DIAGNOSIS — M5489 Other dorsalgia: Secondary | ICD-10-CM | POA: Diagnosis not present

## 2023-11-21 DIAGNOSIS — E1169 Type 2 diabetes mellitus with other specified complication: Secondary | ICD-10-CM | POA: Diagnosis not present

## 2023-11-21 DIAGNOSIS — M549 Dorsalgia, unspecified: Secondary | ICD-10-CM | POA: Diagnosis not present

## 2023-12-05 DIAGNOSIS — M5416 Radiculopathy, lumbar region: Secondary | ICD-10-CM | POA: Diagnosis not present

## 2023-12-05 DIAGNOSIS — M48062 Spinal stenosis, lumbar region with neurogenic claudication: Secondary | ICD-10-CM | POA: Diagnosis not present

## 2024-01-07 NOTE — Progress Notes (Unsigned)
 Cardiology Clinic Note   Date: 01/10/2024 ID: Kentarius, Laible 1951/09/12, MRN [id number]  Primary Cardiologist:  Belva Boyden, MD  Chief Complaint   Manuel Buck is a 73 y.o. male who presents to the clinic today for routine follow up.   Patient Profile   Manuel Buck is followed by Dr. Gollan for the history outlined below.      Past medical history significant for: PAD. ABIs/lower extremity vascular ultrasound 07/19/2018: Minimal atherosclerosis bilateral popliteal arteries, moderate atherosclerosis distally.  Left ATA appears occluded distally with collateral flow seen. PVCs. Hypertension. Hyperlipidemia. Lipid panel 11/21/2023: LDL 121, HDL 43, TG 137, total 191. Cancer of the tongue. T2DM.  In summary, patient was previously followed by [facility] cardiology.  LHC in November 2024 for abnormal stress test demonstrated normal coronary arteries with normal LV function.  He established with Dr. Gollan on 11/08/2022 for PVCs.  Patient reported near syncope after standing in place for a long period of time.  He was found to be mildly orthostatic on exam. He reported chronic lower extremity edema and did not want to stop HCTZ.  He reported myalgias with statins and was started on Zetia .  It was recommended he use compression socks, avoid standing for long periods of time, and increasing hydration.       History of Present Illness    Today, patient is doing well. Patient denies shortness of breath, dyspnea on routine exertion, orthopnea or PND. Mild dyspnea with heavier exertion or longer distance walking. He went to the zoo with his grandkids yesterday and was able to walk all day with no shortness of breath. He has mild lower extremity edema that is at its best in the morning and progresses throughout the day. He manages it with compression socks particularly when he is going to be on his feet for longer periods of time. No chest pain, pressure, or tightness. He reports occasional  palpitations that are not particularly bothersome to him.  He denies lower extremity pain but his legs will get tired with activity.     ROS: All other systems reviewed and are otherwise negative except as noted in History of Present Illness.  EKGs/Labs Reviewed    EKG Interpretation Date/Time:  Tuesday January 10 2024 08:30:36 EDT Ventricular Rate:  65 PR Interval:  214 QRS Duration:  132 QT Interval:  428 QTC Calculation: 445 R Axis:   -49  Text Interpretation: Sinus rhythm with 1st degree A-V block Left axis deviation Non-specific intra-ventricular conduction block When compared with ECG of 05-Nov-2021 12:18, Premature ventricular complexes are no longer Present Confirmed by Morey Ar [phone]) on 01/10/2024 8:43:32 AM    Physical Exam    VS:  BP 134/76   Pulse 61   Ht 5\' 10"  (1.778 m)   Wt 197 lb (89.4 kg)   SpO2 96%   BMI 28.27 kg/m  , BMI Body mass index is 28.27 kg/m.  GEN: Well nourished, well developed, in no acute distress. Neck: No JVD or carotid bruits. Cardiac:  RRR. No murmurs. No rubs or gallops.   Respiratory:  Respirations regular and unlabored. Clear to auscultation without rales, wheezing or rhonchi. GI: Soft, nontender, nondistended. Extremities: Radials/DP/PT 2+ and equal bilaterally. No clubbing or cyanosis. 1+ pitting edema bilateral lower extremities.   Skin: Warm and dry, no rash. Neuro: Strength intact.  Assessment & Plan   PAD ABIs lower extremity vascular ultrasound November 2019 showed minimal atherosclerosis bilateral popliteal arteries, moderate atherosclerosis distally, left ATA  appeared occluded distally with collateral flow seen.  Patient denies lower extremity pain but his legs will get tired with activity.  - Continue aspirin , Zetia .  PVCs Patient reports occasional palpitations that are not particularly bothersome to him. EKG today shows sinus rhythm with 1st degree AV block.  -Patient will contact the office if palpitations  become more frequent or bothersome.   Hypertension BP today 134/76. No headaches or dizziness reported.  Continue Benicar.  Hyperlipidemia/statin myopathy  LDL 121 March 2025, not at goal.  Patient reports myalgias on statins.  - Continue Zetia .  Disposition: Return in 1 year or sooner as needed.          Signed, Lonell Rives. Leonel Mccollum, DNP, NP-C

## 2024-01-10 ENCOUNTER — Encounter: Payer: Self-pay | Admitting: Student

## 2024-01-10 ENCOUNTER — Ambulatory Visit: Attending: Student | Admitting: Student

## 2024-01-10 VITALS — BP 134/76 | HR 61 | Ht 70.0 in | Wt 197.0 lb

## 2024-01-10 DIAGNOSIS — T466X5D Adverse effect of antihyperlipidemic and antiarteriosclerotic drugs, subsequent encounter: Secondary | ICD-10-CM | POA: Diagnosis not present

## 2024-01-10 DIAGNOSIS — I739 Peripheral vascular disease, unspecified: Secondary | ICD-10-CM

## 2024-01-10 DIAGNOSIS — I10 Essential (primary) hypertension: Secondary | ICD-10-CM

## 2024-01-10 DIAGNOSIS — T466X5A Adverse effect of antihyperlipidemic and antiarteriosclerotic drugs, initial encounter: Secondary | ICD-10-CM

## 2024-01-10 DIAGNOSIS — G720 Drug-induced myopathy: Secondary | ICD-10-CM | POA: Diagnosis not present

## 2024-01-10 DIAGNOSIS — I493 Ventricular premature depolarization: Secondary | ICD-10-CM | POA: Diagnosis not present

## 2024-01-10 DIAGNOSIS — I700 Atherosclerosis of aorta: Secondary | ICD-10-CM | POA: Diagnosis not present

## 2024-01-10 DIAGNOSIS — E785 Hyperlipidemia, unspecified: Secondary | ICD-10-CM | POA: Diagnosis not present

## 2024-01-10 NOTE — Patient Instructions (Signed)
 Medication Instructions:   Your Physician recommend you continue on your current medication as directed.     *If you need a refill on your cardiac medications before your next appointment, please call your pharmacy*  Lab Work: None ordered.  If you have labs (blood work) drawn today and your tests are completely normal, you will receive your results only by: MyChart Message (if you have MyChart) OR A paper copy in the mail If you have any lab test that is abnormal or we need to change your treatment, we will call you to review the results.  Testing/Procedures: None ordered.   Follow-Up: At [facility], you and your health needs are our priority.  As part of our continuing mission to provide you with exceptional heart care, our providers are all part of one team.  This team includes your primary Cardiologist (physician) and Advanced Practice Providers or APPs (Physician Assistants and Nurse Practitioners) who all work together to provide you with the care you need, when you need it.  Your next appointment:   12 month(s)  Provider:   You may see Timothy Gollan, MD or one of the following Advanced Practice Providers on your designated Care Team:   Laneta Pintos, NP Gildardo Labrador, PA-C Varney Gentleman, PA-C Cadence [place], PA-C Ronald Cockayne, NP Morey Ar, NP    We recommend signing up for the patient portal called "MyChart".  Sign up information is provided on this After Visit Summary.  MyChart is used to connect with patients for Virtual Visits (Telemedicine).  Patients are able to view lab/test results, encounter notes, upcoming appointments, etc.  Non-urgent messages can be sent to your provider as well.   To learn more about what you can do with MyChart, go to [email or web address].

## 2024-01-16 ENCOUNTER — Ambulatory Visit: Payer: PPO | Admitting: Cardiovascular Disease

## 2024-01-25 DIAGNOSIS — H60392 Other infective otitis externa, left ear: Secondary | ICD-10-CM | POA: Diagnosis not present

## 2024-01-25 DIAGNOSIS — H6122 Impacted cerumen, left ear: Secondary | ICD-10-CM | POA: Diagnosis not present

## 2024-02-11 ENCOUNTER — Other Ambulatory Visit: Payer: Self-pay | Admitting: Cardiovascular Disease

## 2024-05-28 DIAGNOSIS — E1169 Type 2 diabetes mellitus with other specified complication: Secondary | ICD-10-CM | POA: Diagnosis not present

## 2024-05-28 DIAGNOSIS — E782 Mixed hyperlipidemia: Secondary | ICD-10-CM | POA: Diagnosis not present

## 2024-05-28 DIAGNOSIS — I10 Essential (primary) hypertension: Secondary | ICD-10-CM | POA: Diagnosis not present

## 2024-06-04 ENCOUNTER — Other Ambulatory Visit: Payer: Self-pay | Admitting: Internal Medicine

## 2024-06-04 DIAGNOSIS — E1169 Type 2 diabetes mellitus with other specified complication: Secondary | ICD-10-CM | POA: Diagnosis not present

## 2024-06-04 DIAGNOSIS — Z1331 Encounter for screening for depression: Secondary | ICD-10-CM | POA: Diagnosis not present

## 2024-06-04 DIAGNOSIS — Z1211 Encounter for screening for malignant neoplasm of colon: Secondary | ICD-10-CM | POA: Diagnosis not present

## 2024-06-04 DIAGNOSIS — E538 Deficiency of other specified B group vitamins: Secondary | ICD-10-CM | POA: Diagnosis not present

## 2024-06-04 DIAGNOSIS — Z0000 Encounter for general adult medical examination without abnormal findings: Secondary | ICD-10-CM | POA: Diagnosis not present

## 2024-06-04 DIAGNOSIS — I10 Essential (primary) hypertension: Secondary | ICD-10-CM | POA: Diagnosis not present

## 2024-06-04 DIAGNOSIS — Z23 Encounter for immunization: Secondary | ICD-10-CM | POA: Diagnosis not present

## 2024-06-04 DIAGNOSIS — N1831 Chronic kidney disease, stage 3a: Secondary | ICD-10-CM | POA: Diagnosis not present

## 2024-06-04 DIAGNOSIS — G720 Drug-induced myopathy: Secondary | ICD-10-CM | POA: Diagnosis not present

## 2024-06-04 DIAGNOSIS — T466X5A Adverse effect of antihyperlipidemic and antiarteriosclerotic drugs, initial encounter: Secondary | ICD-10-CM | POA: Diagnosis not present

## 2024-06-12 ENCOUNTER — Ambulatory Visit
Admission: RE | Admit: 2024-06-12 | Discharge: 2024-06-12 | Disposition: A | Discharge status: Home / Self Care | Source: Ambulatory Visit | Attending: Internal Medicine | Admitting: Internal Medicine

## 2024-06-12 DIAGNOSIS — N1831 Chronic kidney disease, stage 3a: Secondary | ICD-10-CM | POA: Diagnosis not present

## 2024-06-12 DIAGNOSIS — N281 Cyst of kidney, acquired: Secondary | ICD-10-CM | POA: Diagnosis not present

## 2024-06-18 DIAGNOSIS — H6123 Impacted cerumen, bilateral: Secondary | ICD-10-CM | POA: Diagnosis not present

## 2024-06-18 DIAGNOSIS — H60392 Other infective otitis externa, left ear: Secondary | ICD-10-CM | POA: Diagnosis not present

## 2024-07-04 ENCOUNTER — Other Ambulatory Visit: Payer: Self-pay | Admitting: Internal Medicine

## 2024-07-04 DIAGNOSIS — R1900 Intra-abdominal and pelvic swelling, mass and lump, unspecified site: Secondary | ICD-10-CM

## 2024-07-18 ENCOUNTER — Ambulatory Visit
Admission: RE | Admit: 2024-07-18 | Discharge: 2024-07-18 | Disposition: A | Discharge status: Home / Self Care | Source: Ambulatory Visit | Attending: Internal Medicine | Admitting: Internal Medicine

## 2024-07-18 DIAGNOSIS — N281 Cyst of kidney, acquired: Secondary | ICD-10-CM | POA: Diagnosis not present

## 2024-07-18 DIAGNOSIS — R1900 Intra-abdominal and pelvic swelling, mass and lump, unspecified site: Secondary | ICD-10-CM | POA: Insufficient documentation

## 2024-07-18 DIAGNOSIS — K449 Diaphragmatic hernia without obstruction or gangrene: Secondary | ICD-10-CM | POA: Diagnosis not present

## 2024-07-18 DIAGNOSIS — K5730 Diverticulosis of large intestine without perforation or abscess without bleeding: Secondary | ICD-10-CM | POA: Diagnosis not present

## 2024-07-18 MED ORDER — GADOBUTROL 1 MMOL/ML IV SOLN
9.0000 mL | Freq: Once | INTRAVENOUS | Status: AC | PRN
  Administered 2024-07-18: 9 mL via INTRAVENOUS
